# Patient Record
Sex: Male | Born: 1970 | Race: Black or African American | Hispanic: No | Marital: Married | Smoking: Never smoker
Health system: Southern US, Community
[De-identification: ages and names within clinical notes are randomized; demographics above are authoritative.]

## PROBLEM LIST (undated history)

## (undated) DIAGNOSIS — I10 Essential (primary) hypertension: Secondary | ICD-10-CM

## (undated) DIAGNOSIS — E785 Hyperlipidemia, unspecified: Secondary | ICD-10-CM

## (undated) HISTORY — PX: ARTHROSCOPIC REPAIR ACL: SUR80

## (undated) HISTORY — PX: WISDOM TOOTH EXTRACTION: SHX21

## (undated) HISTORY — DX: Hyperlipidemia, unspecified: E78.5

---

## 2011-09-28 ENCOUNTER — Emergency Department (HOSPITAL_COMMUNITY)
Admission: EM | Admit: 2011-09-28 | Discharge: 2011-09-28 | Disposition: A | Discharge status: Home / Self Care | Payer: BC Managed Care – PPO | Attending: Emergency Medicine | Admitting: Emergency Medicine

## 2011-09-28 ENCOUNTER — Emergency Department (HOSPITAL_COMMUNITY): Payer: BC Managed Care – PPO

## 2011-09-28 ENCOUNTER — Encounter (HOSPITAL_COMMUNITY): Payer: Self-pay | Admitting: *Deleted

## 2011-09-28 DIAGNOSIS — R609 Edema, unspecified: Secondary | ICD-10-CM | POA: Insufficient documentation

## 2011-09-28 DIAGNOSIS — X58XXXA Exposure to other specified factors, initial encounter: Secondary | ICD-10-CM | POA: Insufficient documentation

## 2011-09-28 DIAGNOSIS — Y9361 Activity, american tackle football: Secondary | ICD-10-CM | POA: Insufficient documentation

## 2011-09-28 DIAGNOSIS — S6990XA Unspecified injury of unspecified wrist, hand and finger(s), initial encounter: Secondary | ICD-10-CM

## 2011-09-28 DIAGNOSIS — M79609 Pain in unspecified limb: Secondary | ICD-10-CM | POA: Insufficient documentation

## 2011-09-28 NOTE — ED Notes (Signed)
Pt reports hurt left hand while playing football on Saturday. States heard something pop. Pain to left hand.

## 2011-09-28 NOTE — ED Notes (Signed)
Patient transported to X-ray 

## 2011-09-28 NOTE — ED Provider Notes (Signed)
History     CSN: [id number]  Arrival date & time 09/28/11  1421   First MD Initiated Contact with Patient 09/28/11 1457      Chief Complaint  Patient presents with  . Hand Pain    (Consider location/radiation/quality/duration/timing/severity/associated sxs/prior treatment) HPI Comments: Patient states he was playing football 3 days ago and dislocated R 5th finger which he pulled back into place   Now still having proximal 5th finger pain and hand pain without deformity   Patient is a 41 y.o. male presenting with hand pain. The history is provided by the patient.  Hand Pain This is a new problem. The current episode started in the past 7 days. Pertinent negatives include no joint swelling.    History reviewed. No pertinent past medical history.  Past Surgical History  Procedure Date  . Arthroscopic repair acl     No family history on file.  History  Substance Use Topics  . Smoking status: Never Smoker   . Smokeless tobacco: Not on file  . Alcohol Use: No      Review of Systems  Musculoskeletal: Negative for joint swelling.    Allergies  Review of patient's allergies indicates no known allergies.  Home Medications   Current Outpatient Rx  Name Route Sig Dispense Refill  . IBUPROFEN 400 MG PO TABS Oral Take 400 mg by mouth every 6 (six) hours as needed. For pain relief      BP 164/115  Pulse 90  Temp(Src) 97.6 F (36.4 C) (Oral)  Resp 14  SpO2 99%  Physical Exam  Constitutional: He is oriented to person, place, and time. He appears well-developed and well-nourished.  HENT:  Head: Normocephalic.  Eyes: Pupils are equal, round, and reactive to light.  Neck: Normal range of motion.  Cardiovascular: Normal rate.   Pulmonary/Chest: Effort normal.  Musculoskeletal: Normal range of motion. He exhibits edema and tenderness.       Hands: Neurological: He is alert and oriented to person, place, and time.  Skin: Skin is warm.  Psychiatric: He has a normal  mood and affect.    ED Course  Procedures (including critical care time)  Labs Reviewed - No data to display Dg Hand Complete Left  09/28/2011  *RADIOLOGY REPORT*  Clinical Data: Injured left hand.  LEFT HAND - COMPLETE 3+ VIEW  Comparison: None  Findings: The joint spaces are maintained.  No acute fracture.  IMPRESSION: No acute bony findings.  Original Report Authenticated By: P. Kalman Jewels, M.D.     1. Hand injury     Splint has been placed over the left fourth and fifth fingers to restrict flexion, extension or abduction for the next several days  MDM  We'll x-ray to rule out medical carpal fracture        Garald Balding, NP 09/28/11 1717  Garald Balding, NP 09/28/11 1717

## 2011-09-28 NOTE — Discharge Instructions (Signed)
Hand Injuries There are many types of hand injuries. You or your child may have a minor broken bone (fracture), sprain, bruises, or burns on the hand. HOME CARE  Keep the hand raised (elevated) above the level of the heart. Do this for the first few days until the pain and puffiness (swelling) get better.   Hand bandages (dressings) and splints are used to keep the hand still. This helps with pain and prevents more injury.   Do not remove the bandage and splint until your doctor says it is okay.   For broken bones, sprains, and bruises, you may put ice on the injured area.   Put ice in a plastic bag.   Place a towel between the skin and the bag.   Leave the ice on for 15 to 20 minutes every few hours. Do this for 2 to 3 days.   Medicine for pain and redness or puffiness (inflammation) is often helpful.   Some hand motion after an injury can decrease stiffness.   Do not do any activities that increase pain in the hand.   See your doctor for follow-up care as told.  GET HELP RIGHT AWAY IF:   The pain and puffiness get worse.   The pain is not controlled with medicine.   You or your child has a temperature by mouth above 102 F (38.9 C), not controlled by medicine.   There is pain when moving the fingers.   There is fluid (pus) coming from the wound.  MAKE SURE YOU:   Understand these instructions.   Will watch this condition.   Will get help right away if you or your child is not doing well or gets worse.  Document Released: 09/14/2009 Document Revised: 06/09/2011 Document Reviewed: 09/14/2009 [facility] Patient Information 2012 [place]. There are no fractures in the hand or in your fifth finger.  The fingers have been splinted and buddy taped prevent over flexion or extension.  For the next few days.  He can remove this on Saturday and reapply if needed, you can take over-the-counter ibuprofen or Advil on a regular basis for the next 3 days. If you still have  discomfort with use, please make an appointment with Dr. Apolonio Schneiders for further evaluation

## 2011-09-29 NOTE — ED Provider Notes (Signed)
Medical screening examination/treatment/procedure(s) were performed by non-physician practitioner and as supervising physician I was immediately available for consultation/collaboration.   Mylinda Latina III, MD 09/29/11 [phone]

## 2013-04-20 IMAGING — CR DG HAND COMPLETE 3+V*L*
3 series · 3 of 3 positions shown · non-contrast
Comparison: None

CLINICAL DATA: Injured left hand.

LEFT HAND - COMPLETE 3+ VIEW

[x hand pa left]
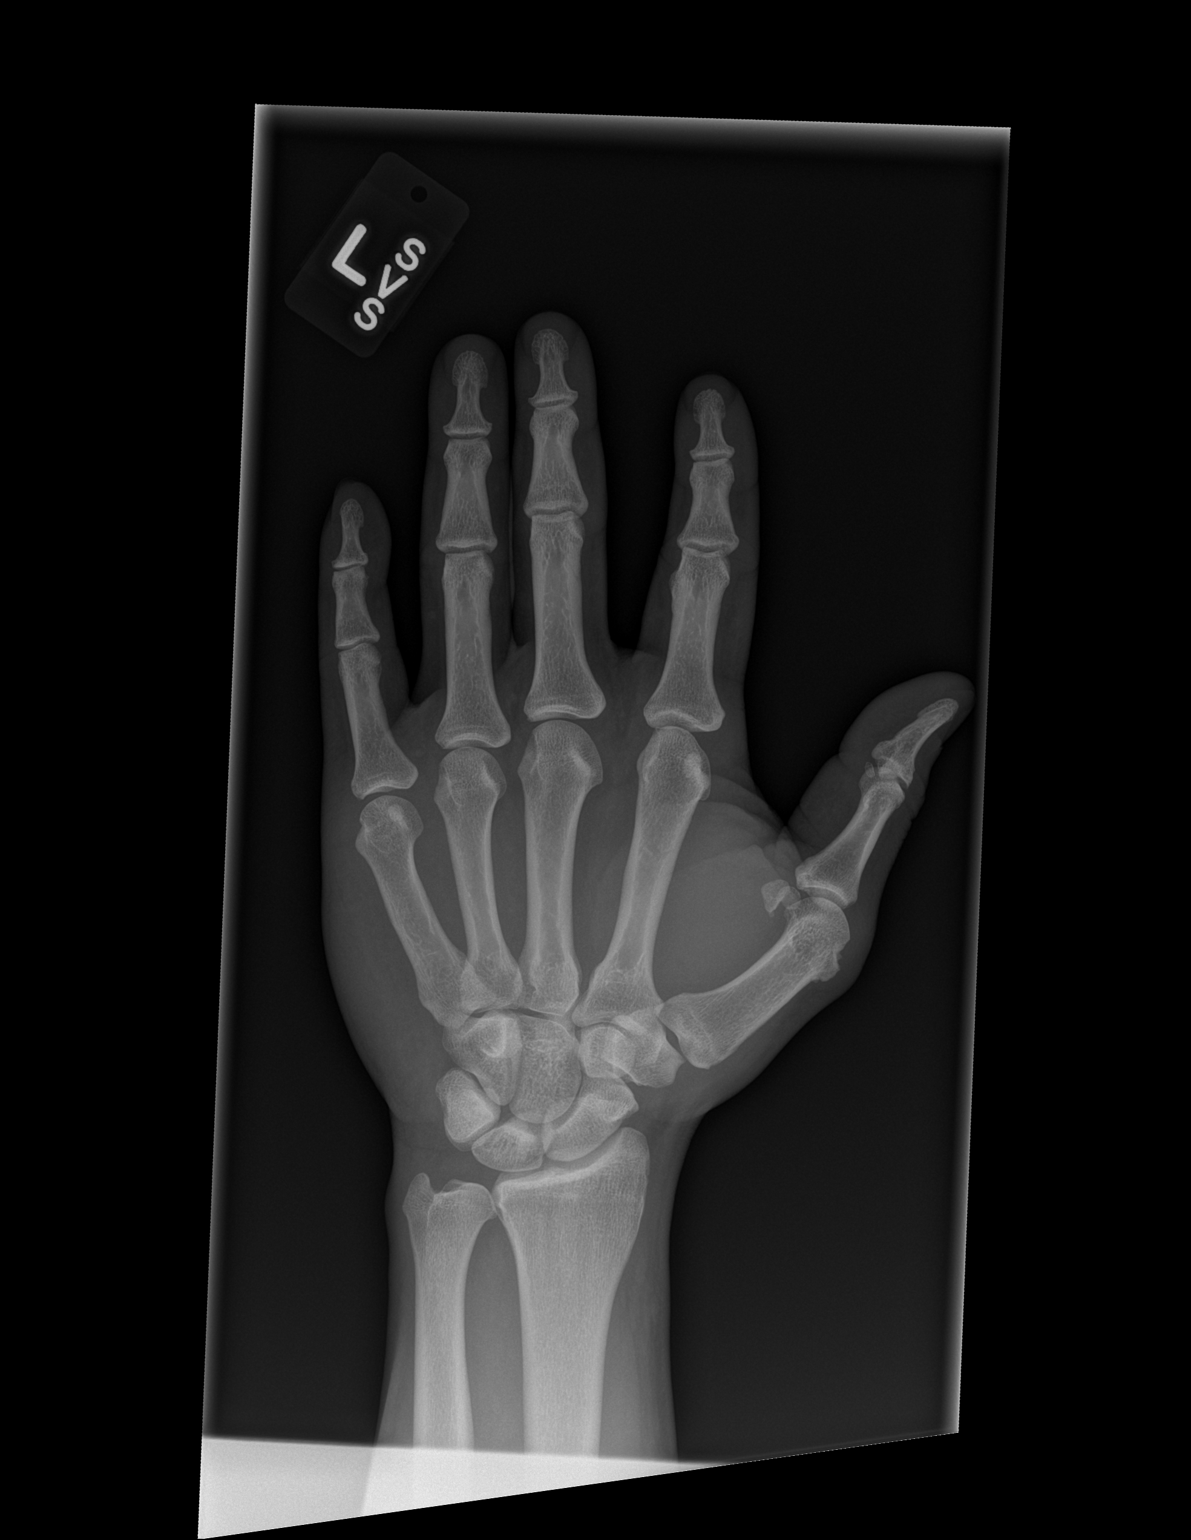

[x hand obl left]
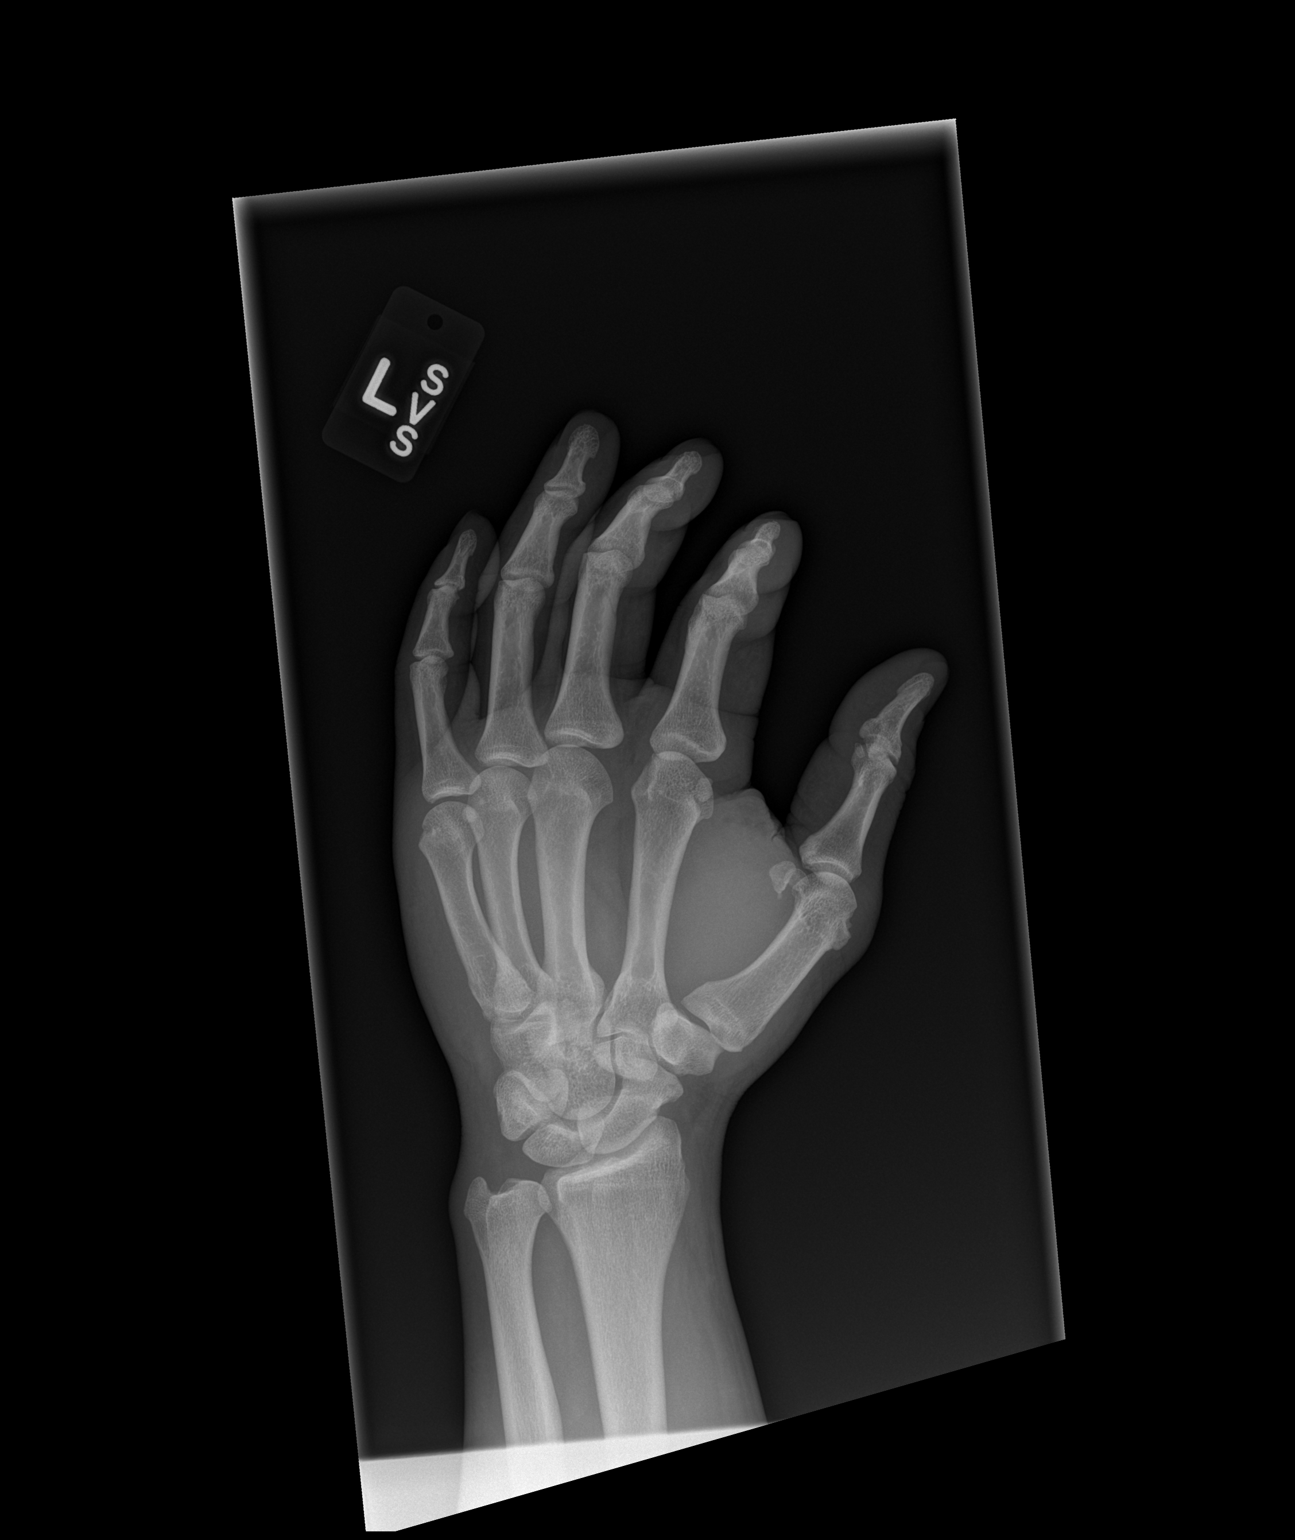

[x hand lat left]
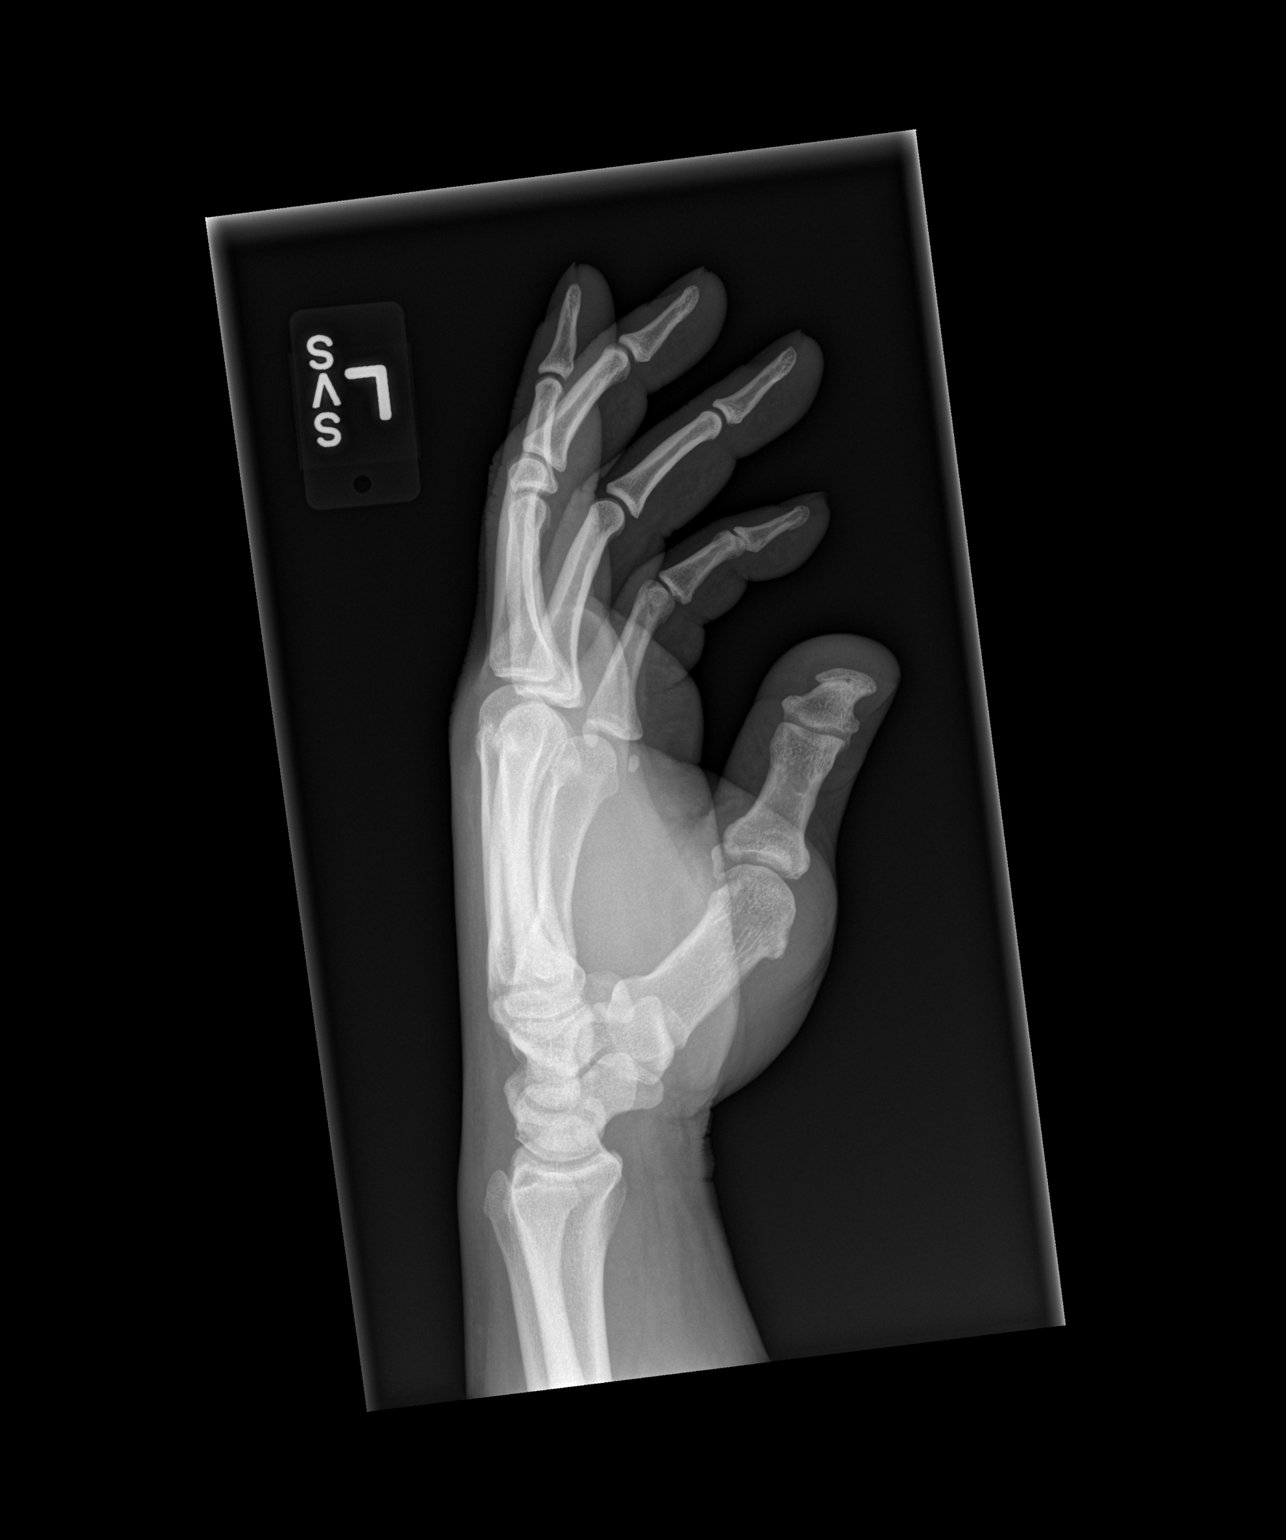

[3 of 3 positions shown; findings below may reference images not displayed]

FINDINGS: The joint spaces are maintained.  No acute fracture.
IMPRESSION: No acute bony findings.

## 2018-06-12 ENCOUNTER — Encounter: Payer: Self-pay | Admitting: Family Medicine

## 2018-06-12 ENCOUNTER — Ambulatory Visit (INDEPENDENT_AMBULATORY_CARE_PROVIDER_SITE_OTHER): Payer: BLUE CROSS/BLUE SHIELD | Admitting: Family Medicine

## 2018-06-12 VITALS — BP 150/100 | HR 75 | Temp 98.1°F | Ht 68.0 in | Wt 227.5 lb

## 2018-06-12 DIAGNOSIS — M25551 Pain in right hip: Secondary | ICD-10-CM

## 2018-06-12 DIAGNOSIS — R350 Frequency of micturition: Secondary | ICD-10-CM

## 2018-06-12 DIAGNOSIS — R7309 Other abnormal glucose: Secondary | ICD-10-CM | POA: Diagnosis not present

## 2018-06-12 DIAGNOSIS — I10 Essential (primary) hypertension: Secondary | ICD-10-CM | POA: Diagnosis not present

## 2018-06-12 DIAGNOSIS — Z7689 Persons encountering health services in other specified circumstances: Secondary | ICD-10-CM

## 2018-06-12 DIAGNOSIS — E785 Hyperlipidemia, unspecified: Secondary | ICD-10-CM

## 2018-06-12 DIAGNOSIS — Z131 Encounter for screening for diabetes mellitus: Secondary | ICD-10-CM

## 2018-06-12 DIAGNOSIS — Z125 Encounter for screening for malignant neoplasm of prostate: Secondary | ICD-10-CM | POA: Diagnosis not present

## 2018-06-12 DIAGNOSIS — Z13220 Encounter for screening for lipoid disorders: Secondary | ICD-10-CM | POA: Diagnosis not present

## 2018-06-12 LAB — URINALYSIS, ROUTINE W REFLEX MICROSCOPIC
BILIRUBIN URINE: NEGATIVE
Bacteria, UA: NONE SEEN /HPF
GLUCOSE, UA: NEGATIVE
HGB URINE DIPSTICK: NEGATIVE
KETONES UR: NEGATIVE
LEUKOCYTES UA: NEGATIVE
NITRITE: NEGATIVE
PH: 6 (ref 5.0–8.0)
RBC / HPF: NONE SEEN /HPF (ref 0–2)
Specific Gravity, Urine: 1.02 (ref 1.001–1.03)
Squamous Epithelial / LPF: NONE SEEN /HPF (ref <=5)
WBC, UA: NONE SEEN /HPF (ref 0–5)

## 2018-06-12 LAB — MICROSCOPIC MESSAGE

## 2018-06-12 MED ORDER — LISINOPRIL 20 MG PO TABS: 20.0000 mg | ORAL_TABLET | Freq: Every day | ORAL | 3 refills | Status: DC

## 2018-06-12 NOTE — Progress Notes (Signed)
Patient ID: Jason Leonard, male    DOB: 1970/10/28, 47 y.o.   MRN: [id number]  PCP: Delsa Grana, PA-C  Chief Complaint  Patient presents with  . Establish Care    Patient has concerns of frequent urination, and right hip pain.    Subjective:   Jason Leonard is a 47 y.o. male, presents to clinic with CC of urinary frequency and unrelated intermittent right hip pain. Urinary sx are newer - hes drinking mt dew at night, also drinks coffee, he's having increased urinary frequency over nights, does work third shift, he also drinks coconut water and water throughout the day and while at work.  Urine frequency gradual began this week, he denies dyuria, pain, hematuria, dribbling, double voiding, anal discharge. He describes his urine as clear to yellow - no weight loss, no fatigue, no change in appetite, no abd pain, N, V, flank pain  Right hip pain when he's laid down for too long or moves too fast he has worse pain, bothering him for 2 years. Pain happens with external rotation, no radiation to groin or down leg.  No crepitus, no exacerbation of pain with weight bearing.  No injury Moved here in 2010 - [place] -cannot remember PCP states he is not really been to a doctor in a long time  Says he is to have some elevated cholesterol  Blood pressure is elevated here today, he denies any history of elevated blood pressure.  He denies any chest pain, shortness of breath, near syncope, palpitations.  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   There are no active problems to display for this patient.    Prior to Admission medications   Medication Sig Start Date End Date Taking?  Authorizing Provider  magnesium 30 MG tablet Take 30 mg by mouth 2 (two) times daily.   Yes [provider]  Omega 3-6-9 Fatty Acids (OMEGA 3-6-9 COMPLEX PO) Take by mouth.   Yes [provider]     No Known Allergies   Family History  Problem Relation Age of Onset  . Hypertension Mother   . Hypertension Father   . Lupus Sister   . Diabetes Maternal Grandfather   . Cancer Maternal Grandfather   . Cancer Paternal Grandmother      Social History   Socioeconomic History  . Marital status: Married    Spouse name: Not on file  . Number of children: 2  . Years of education: Not on file  . Highest education level: Not on file  Occupational History  . Not on file  Social Needs  . Financial resource strain: Not on file  . Food insecurity:    Worry: Not on file    Inability: Not on file  . Transportation needs:    Medical: Not on file    Non-medical: Not on file  Tobacco Use  . Smoking status: Never Smoker  . Smokeless tobacco: Never Used  Substance and Sexual Activity  . Alcohol use: No  . Drug use: No  . Sexual activity: Yes    Comment: one partner wife - 2011  Lifestyle  . Physical activity:  Days per week: 7 days    Minutes per session: 30 min  . Stress: Not on file  Relationships  . Social connections:    Talks on phone: Not on file    Gets together: Not on file    Attends religious service: Not on file    Active member of club or organization: Not on file    Attends meetings of clubs or organizations: Not on file    Relationship status: Not on file  . Intimate partner violence:    Fear of current or ex partner: Not on file    Emotionally abused: Not on file    Physically abused: Not on file    Forced sexual activity: Not on file  Other Topics Concern  . Not on file  Social History Narrative  . Not on file     Review of Systems  Constitutional: Negative.  Negative for activity change, appetite change, fatigue and unexpected weight  change.  HENT: Negative.   Eyes: Negative.   Respiratory: Negative.  Negative for shortness of breath.   Cardiovascular: Negative.  Negative for chest pain, palpitations and leg swelling.  Gastrointestinal: Negative.  Negative for abdominal pain and blood in stool.  Endocrine: Negative.   Genitourinary: Negative.  Negative for decreased urine volume, difficulty urinating, testicular pain and urgency.  Musculoskeletal: Negative.  Negative for back pain, joint swelling and neck pain.  Skin: Negative.  Negative for color change and pallor.  Allergic/Immunologic: Negative.   Neurological: Negative.  Negative for syncope, weakness, light-headedness and numbness.  Psychiatric/Behavioral: Negative.  Negative for confusion, dysphoric mood, self-injury and suicidal ideas. The patient is not nervous/anxious.   All other systems reviewed and are negative.      Objective:    Vitals:   06/12/18 1419  BP: (!) 150/100  Pulse: 75  Temp: 98.1 F (36.7 C)  TempSrc: Oral  SpO2: 98%  Weight: 227 lb 8 oz (103.2 kg)  Height: 5\' 8"  (1.727 m)      Physical Exam Vitals signs and nursing note reviewed.  Constitutional:      General: He is not in acute distress.    Appearance: Normal appearance. He is well-developed. He is not ill-appearing, toxic-appearing or diaphoretic.     Comments: Muscular, fit, well appearing AAM, NAD  HENT:     Head: Normocephalic and atraumatic.     Jaw: No trismus.     Right Ear: Tympanic membrane, ear canal and external ear normal.     Left Ear: Tympanic membrane, ear canal and external ear normal.     Nose: Nose normal. No mucosal edema, congestion or rhinorrhea.     Right Sinus: No maxillary sinus tenderness or frontal sinus tenderness.     Left Sinus: No maxillary sinus tenderness or frontal sinus tenderness.     Mouth/Throat:     Mouth: Mucous membranes are moist.     Pharynx: Uvula midline. No oropharyngeal exudate, posterior oropharyngeal erythema or uvula  swelling.  Eyes:     General: Lids are normal.     Conjunctiva/sclera: Conjunctivae normal.     Pupils: Pupils are equal, round, and reactive to light.  Neck:     Musculoskeletal: Normal range of motion and neck supple. No neck rigidity.     Trachea: Trachea and phonation normal. No tracheal deviation.  Cardiovascular:     Rate and Rhythm: Normal rate and regular rhythm.     Pulses: Normal pulses.          Radial  pulses are 2+ on the right side and 2+ on the left side.       Posterior tibial pulses are 2+ on the right side and 2+ on the left side.     Heart sounds: Normal heart sounds. No murmur. No friction rub. No gallop.   Pulmonary:     Effort: Pulmonary effort is normal. No respiratory distress.     Breath sounds: Normal breath sounds. No stridor. No wheezing, rhonchi or rales.  Abdominal:     General: Bowel sounds are normal. There is no distension.     Palpations: Abdomen is soft. There is no mass.     Tenderness: There is no abdominal tenderness. There is no right CVA tenderness, left CVA tenderness, guarding or rebound.  Musculoskeletal: Normal range of motion.     Right hip: He exhibits normal range of motion, normal strength, no bony tenderness, no swelling, no crepitus, no deformity and no laceration.     Left hip: Normal.     Comments: Right hip pain with external rotation and flexion, no decreased ROM, no bony tenderness, no crepitus palpated, no IT band tenderness  Skin:    General: Skin is warm and dry.     Capillary Refill: Capillary refill takes less than 2 seconds.     Coloration: Skin is not jaundiced or pale.     Findings: No erythema or rash.  Neurological:     Mental Status: He is alert and oriented to person, place, and time.     Motor: No weakness.     Coordination: Coordination normal.     Gait: Gait normal.  Psychiatric:        Mood and Affect: Mood normal.        Speech: Speech normal.        Behavior: Behavior normal.        Thought Content: Thought  content normal.        Judgment: Judgment normal.            Assessment & Plan:   .47 y/o male, new to establish care, presents with urinary frequency x1 week and unrelated several months of right hip pain.    1. Urinary frequency History given slightly unusual the amount of frequency described as not very concerning to me for infection.  He has had no change in urine color or odor does not seem suspicious at all for kidney stone and not highly suspicious for UTI, no totally consistent with LUTS either - will rule out UTI, he seems low risk for STDs, monogamous with wife for about 8 years but will screen for GC chlamydia and trichomonas, screening PSA.   - Urinalysis, Routine w reflex microscopic - PSA - C. trachomatis/N. gonorrhoeae RNA - Trichomonas vaginalis RNA, Ql,Males UA negative except for trace protein -no other signs of infection  2. 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 Hypertension, unspecified type 150/100 in clinic, asymptomatic, no concern for endorgan damage, no cardiac symptoms at all, patient believes it is from slight weight gain and decreased  working out over the last month or so, feel like it high enough that we should start treatment and follow-up.  Encouraged to work on [employer], aerobic activity in addition to his other exercise.  Return for labs - lisinopril (PRINIVIL,ZESTRIL) 20 MG tablet; Take 1 tablet (20 mg total) by mouth daily.  Dispense: 90 tablet; Refill: 3  4. Encounter to establish care with new  doctor Request records All history updated in chart as able  5. Screening for hyperlipidemia Patient gives history of elevated cholesterol, return for fasting lipids - Lipid Panel  6. Screening for diabetes mellitus Return for fasting labs - CMP and Hemoglobin A1c Will obtain A1c with urinary frequency instead of waiting for just the fasting sugar  Obtain basic labs, records, start lisinopril, f/up in 1 months for BP recheck and set up CPE in near future  Delsa Grana, PA-C 06/12/18 2:28 PM

## 2018-06-12 NOTE — Patient Instructions (Addendum)
Return when convenient - for fasting labs to check cholesterol  8 hours only water or black coffee plain no cream no coffee

## 2018-06-13 LAB — LIPID PANEL
Cholesterol: 195 mg/dL (ref <200)
HDL: 50 mg/dL (ref >40)
LDL Cholesterol (Calc): 116 mg/dL (calc) — ABNORMAL HIGH
Non-HDL Cholesterol (Calc): 145 mg/dL (calc) — ABNORMAL HIGH (ref <130)
Total CHOL/HDL Ratio: 3.9 (calc) (ref <5.0)
Triglycerides: 175 mg/dL — ABNORMAL HIGH (ref <150)

## 2018-06-13 LAB — HEMOGLOBIN A1C
Hgb A1c MFr Bld: 5.6 % of total Hgb (ref <5.7)
MEAN PLASMA GLUCOSE: 114 (calc)
eAG (mmol/L): 6.3 (calc)

## 2018-06-13 LAB — PSA: PSA: 1.5 ng/mL

## 2018-06-13 LAB — COMPLETE METABOLIC PANEL WITHOUT GFR
AG Ratio: 1.6 (calc) (ref 1.0–2.5)
ALT: 20 U/L (ref 9–46)
AST: 32 U/L (ref 10–40)
Albumin: 4.5 g/dL (ref 3.6–5.1)
Alkaline phosphatase (APISO): 44 U/L (ref 40–115)
BUN/Creatinine Ratio: 13 (calc) (ref 6–22)
BUN: 18 mg/dL (ref 7–25)
CO2: 27 mmol/L (ref 20–32)
Calcium: 9.7 mg/dL (ref 8.6–10.3)
Chloride: 101 mmol/L (ref 98–110)
Creat: 1.43 mg/dL — ABNORMAL HIGH (ref 0.60–1.35)
GFR, Est African American: 67 mL/min/{1.73_m2}
GFR, Est Non African American: 58 mL/min/{1.73_m2} — ABNORMAL LOW
Globulin: 2.8 g/dL (ref 1.9–3.7)
Glucose, Bld: 79 mg/dL (ref 65–99)
Potassium: 4.2 mmol/L (ref 3.5–5.3)
Sodium: 140 mmol/L (ref 135–146)
Total Bilirubin: 0.7 mg/dL (ref 0.2–1.2)
Total Protein: 7.3 g/dL (ref 6.1–8.1)

## 2018-06-14 ENCOUNTER — Encounter: Payer: Self-pay | Admitting: Family Medicine

## 2018-06-14 DIAGNOSIS — I10 Essential (primary) hypertension: Secondary | ICD-10-CM | POA: Insufficient documentation

## 2018-06-14 DIAGNOSIS — E785 Hyperlipidemia, unspecified: Secondary | ICD-10-CM | POA: Insufficient documentation

## 2018-06-14 LAB — TRICHOMONAS VAGINALIS RNA, QL,MALES: Trichomonas vaginalis RNA: NOT DETECTED

## 2018-06-14 LAB — C. TRACHOMATIS/N. GONORRHOEAE RNA
C. trachomatis RNA, TMA: NOT DETECTED
N. gonorrhoeae RNA, TMA: NOT DETECTED

## 2018-06-14 MED ORDER — SIMVASTATIN 20 MG PO TABS: 20.0000 mg | ORAL_TABLET | Freq: Every day | ORAL | 3 refills | Status: DC

## 2018-07-03 ENCOUNTER — Ambulatory Visit (INDEPENDENT_AMBULATORY_CARE_PROVIDER_SITE_OTHER): Payer: BLUE CROSS/BLUE SHIELD | Admitting: Family Medicine

## 2018-07-03 ENCOUNTER — Encounter: Payer: Self-pay | Admitting: Family Medicine

## 2018-07-03 VITALS — BP 150/98 | HR 89 | Temp 98.7°F | Resp 15 | Ht 68.0 in | Wt 226.4 lb

## 2018-07-03 DIAGNOSIS — Z0000 Encounter for general adult medical examination without abnormal findings: Secondary | ICD-10-CM | POA: Diagnosis not present

## 2018-07-03 DIAGNOSIS — N184 Chronic kidney disease, stage 4 (severe): Secondary | ICD-10-CM

## 2018-07-03 DIAGNOSIS — E782 Mixed hyperlipidemia: Secondary | ICD-10-CM | POA: Diagnosis not present

## 2018-07-03 DIAGNOSIS — I129 Hypertensive chronic kidney disease with stage 1 through stage 4 chronic kidney disease, or unspecified chronic kidney disease: Secondary | ICD-10-CM | POA: Insufficient documentation

## 2018-07-03 DIAGNOSIS — I10 Essential (primary) hypertension: Secondary | ICD-10-CM | POA: Diagnosis not present

## 2018-07-03 LAB — COMPLETE METABOLIC PANEL WITH GFR
AG Ratio: 1.5 (calc) (ref 1.0–2.5)
ALBUMIN MSPROF: 4.2 g/dL (ref 3.6–5.1)
ALKALINE PHOSPHATASE (APISO): 44 U/L (ref 40–115)
ALT: 24 U/L (ref 9–46)
AST: 29 U/L (ref 10–40)
BUN / CREAT RATIO: 12 (calc) (ref 6–22)
BUN: 17 mg/dL (ref 7–25)
CO2: 31 mmol/L (ref 20–32)
CREATININE: 1.46 mg/dL — AB (ref 0.60–1.35)
Calcium: 9.8 mg/dL (ref 8.6–10.3)
Chloride: 104 mmol/L (ref 98–110)
GFR, EST AFRICAN AMERICAN: 65 mL/min/{1.73_m2} (ref >=60)
GFR, Est Non African American: 56 mL/min/{1.73_m2} — ABNORMAL LOW (ref >=60)
Globulin: 2.8 g/dL (calc) (ref 1.9–3.7)
Glucose, Bld: 93 mg/dL (ref 65–99)
Potassium: 4.1 mmol/L (ref 3.5–5.3)
Sodium: 143 mmol/L (ref 135–146)
TOTAL PROTEIN: 7 g/dL (ref 6.1–8.1)
Total Bilirubin: 0.5 mg/dL (ref 0.2–1.2)

## 2018-07-03 MED ORDER — AMLODIPINE BESY-BENAZEPRIL HCL 5-40 MG PO CAPS: 1.0000 | ORAL_CAPSULE | Freq: Every day | ORAL | 0 refills | Status: DC

## 2018-07-03 NOTE — Progress Notes (Signed)
Patient: Jason Leonard, Male    DOB: [birth year], 47 y.o.   MRN: [id number] Visit Date: 07/03/2018  Today's Provider: Delsa Grana, PA-C   Chief Complaint  Patient presents with  . Annual Exam    Patient states no concerns this visit.   Subjective:    Annual physical exam Jason Leonard is a 47 y.o. male who presents today for health maintenance and complete physical. He feels well. He reports exercising a lot, but currently less than he normally does.Marland Kitchen He reports he is sleeping fairly well (works third shift)..  -----------------------------------------------------------------   Follow up for HTN and HLD  Started statin and lisinopril 3 weeks ago, reports good compliance with meds, maybe missed one dose, BP more elevated today that last visit, diet has been worse, says hes not exercising as much as he would like to right now.  He is working 2 jobs.  Not doing any adjustment to diet after receiving cholesterol handout in mail with his labs.   He is tolerating statin medication, no SE, no myalgias.  Bp more elevated today, no HA, CP, SOB, palpitations, LE edema, fatigue, orthopnea, visual disturbances.  He continues to endorse right hip/groin pain, continues to re-pole it with quick movements when he forgets about it he has not seen his colleague who is athletic trainer or in sports medicine yet he will see him in the next 2 weeks.    His urinary symptoms have resolved, no more nocturia urinary frequency or urgency.  Review of Systems  Constitutional: Negative.   HENT: Negative.   Eyes: Negative.   Respiratory: Negative.   Cardiovascular: Negative.   Gastrointestinal: Negative.   Endocrine: Negative.   Genitourinary: Negative.   Musculoskeletal: Negative.   Skin: Negative.   Allergic/Immunologic: Negative.   Neurological: Negative.   Hematological: Negative.   Psychiatric/Behavioral: Negative.   All other systems reviewed and are negative.   Social History      He   reports that he has never smoked. He has never used smokeless tobacco. He reports that he does not drink alcohol or use drugs.       Social History   Socioeconomic History  . Marital status: Married    Spouse name: Not on file  . Number of children: 2  . Years of education: Not on file  . Highest education level: Not on file  Occupational History  . Not on file  Social Needs  . Financial resource strain: Not on file  . Food insecurity:    Worry: Not on file    Inability: Not on file  . Transportation needs:    Medical: Not on file    Non-medical: Not on file  Tobacco Use  . Smoking status: Never Smoker  . Smokeless tobacco: Never Used  Substance and Sexual Activity  . Alcohol use: No  . Drug use: No  . Sexual activity: Yes    Comment: one partner wife - 2011  Lifestyle  . Physical activity:    Days per week: 7 days    Minutes per session: 30 min  . Stress: Not on file  Relationships  . Social connections:    Talks on phone: Not on file    Gets together: Not on file    Attends religious service: Not on file    Active member of club or organization: Not on file    Attends meetings of clubs or organizations: Not on file    Relationship status: Not on file  Other Topics  Concern  . Not on file  Social History Narrative  . Not on file    Past Medical History:  Diagnosis Date  . Hyperlipidemia    low HDL, high LDL - no meds, diet controlled     Patient Active Problem List   Diagnosis Date Noted  . Hypertension 06/14/2018  . Hyperlipidemia 06/14/2018    Past Surgical History:  Procedure Laterality Date  . ARTHROSCOPIC REPAIR ACL      Family History        Family Status  Relation Name Status  . Mother  (Not Specified)  . Father  (Not Specified)  . Sister  (Not Specified)  . MGF  (Not Specified)  . PGM  (Not Specified)        His family history includes Cancer in his maternal grandfather and paternal grandmother; Diabetes in his maternal grandfather;  Hypertension in his father and mother; Lupus in his sister.      No Known Allergies   Current Outpatient Medications:  .  lisinopril (PRINIVIL,ZESTRIL) 20 MG tablet, Take 1 tablet (20 mg total) by mouth daily., Disp: 90 tablet, Rfl: 3 .  magnesium 30 MG tablet, Take 30 mg by mouth 2 (two) times daily., Disp: , Rfl:  .  Omega 3-6-9 Fatty Acids (OMEGA 3-6-9 COMPLEX PO), Take by mouth., Disp: , Rfl:  .  simvastatin (ZOCOR) 20 MG tablet, Take 1 tablet (20 mg total) by mouth at bedtime., Disp: 90 tablet, Rfl: 3 .  amLODipine-benazepril (LOTREL) 5-40 MG capsule, Take 1 capsule by mouth daily., Disp: 30 capsule, Rfl: 0   Patient Care Team: Delsa Grana, PA-C as PCP - General (Family Medicine)      Objective:   Vitals: BP (!) 150/98   Pulse 89   Temp 98.7 F (37.1 C) (Oral)   Resp 15   Ht 5\' 8"  (1.727 m)   Wt 226 lb 6 oz (102.7 kg)   SpO2 98%   BMI 34.42 kg/m    Vitals:   07/03/18 1515  BP: (!) 150/98  Pulse: 89  Resp: 15  Temp: 98.7 F (37.1 C)  TempSrc: Oral  SpO2: 98%  Weight: 226 lb 6 oz (102.7 kg)  Height: 5\' 8"  (1.727 m)     Physical Exam Vitals signs and nursing note reviewed.  Constitutional:      General: He is not in acute distress.    Appearance: Normal appearance. He is well-developed. He is not ill-appearing, toxic-appearing or diaphoretic.  HENT:     Head: Normocephalic and atraumatic.     Jaw: No trismus.     Right Ear: Tympanic membrane, ear canal and external ear normal.     Left Ear: Tympanic membrane, ear canal and external ear normal.     Nose: Nose normal. No mucosal edema, congestion or rhinorrhea.     Right Sinus: No maxillary sinus tenderness or frontal sinus tenderness.     Left Sinus: No maxillary sinus tenderness or frontal sinus tenderness.     Mouth/Throat:     Pharynx: Uvula midline. No oropharyngeal exudate, posterior oropharyngeal erythema or uvula swelling.  Eyes:     General: Lids are normal.     Conjunctiva/sclera: Conjunctivae  normal.     Pupils: Pupils are equal, round, and reactive to light.  Neck:     Musculoskeletal: Normal range of motion and neck supple. No neck rigidity.     Trachea: Trachea and phonation normal. No tracheal deviation.  Cardiovascular:     Rate and Rhythm:  Normal rate and regular rhythm.     Pulses: Normal pulses.          Radial pulses are 2+ on the right side and 2+ on the left side.       Posterior tibial pulses are 2+ on the right side and 2+ on the left side.     Heart sounds: Normal heart sounds. No murmur. No friction rub. No gallop.   Pulmonary:     Effort: Pulmonary effort is normal. No respiratory distress.     Breath sounds: Normal breath sounds. No stridor. No wheezing, rhonchi or rales.  Chest:     Chest wall: No tenderness.  Abdominal:     General: Bowel sounds are normal. There is no distension.     Palpations: Abdomen is soft. There is no mass.     Tenderness: There is no abdominal tenderness. There is no guarding or rebound.     Hernia: No hernia is present.  Musculoskeletal: Normal range of motion.  Lymphadenopathy:     Cervical: No cervical adenopathy.  Skin:    General: Skin is warm and dry.     Capillary Refill: Capillary refill takes less than 2 seconds.     Findings: No rash.  Neurological:     General: No focal deficit present.     Mental Status: He is alert and oriented to person, place, and time.     Gait: Gait normal.  Psychiatric:        Mood and Affect: Mood normal.        Speech: Speech normal.        Behavior: Behavior normal.      Depression Screen PHQ 2/9 Scores 07/03/2018  PHQ - 2 Score 0  PHQ- 9 Score 0     Office Visit from 07/03/2018 in [place]  AUDIT-C Score  0       Assessment & Plan:     Routine Health Maintenance and Physical Exam  Exercise Activities and Dietary recommendations Goals - increase aerobic exercise, DASH diet and avoid high fat foods    Discussed health benefits of physical  activity, and encouraged him to engage in regular exercise appropriate for his age and condition.    There is no immunization history on file for this patient.  Health Maintenance  Topic Date Due  . HIV Screening  09/20/1985  . TETANUS/TDAP  09/20/1989  . INFLUENZA VACCINE  02/02/2019 (Originally 02/01/2018)    Will do HIV with next labs Tdap deferred Flu deferred     Bp recheck 2x on right arm while in exam room for CPE - BP 142/98 and 140/96  Problem List Items Addressed This Visit      Cardiovascular and Mediastinum   Hypertension    BP elevated, not at goal, no improvement with 20 mg lisinopril. More concerned for diastolic elevation - would like to switch meds to combo pill with ACEI or ARB + norvasc Hold lisinopril 20 and start lotrel 5-40 - monitor BP and call with readings in 2 weeks (encouraged use of mychart - daughter is a nurse who will check his BP for him) Dash diet Call sooner for recheck if BP >140/90 on average F/up for recheck of labs and routine f/up visit March 2020      Relevant Medications   amLODipine-benazepril (LOTREL) 5-40 MG capsule   Other Relevant Orders   COMPLETE METABOLIC PANEL WITH GFR   Lipid panel   COMPLETE METABOLIC PANEL WITH GFR  Genitourinary   CKD stage 4 secondary to hypertension ([place])    Recheck renal function after starting ACEI      Relevant Orders   COMPLETE METABOLIC PANEL WITH GFR     Other   Hyperlipidemia    Tolerating statin, continue med, work on diet and exercise, due for FLP and recheck CMP 09/11/2018      Relevant Medications   amLODipine-benazepril (LOTREL) 5-40 MG capsule   Other Relevant Orders   COMPLETE METABOLIC PANEL WITH GFR   Lipid panel   COMPLETE METABOLIC PANEL WITH GFR    Other Visit Diagnoses    General medical exam    -  Primary       Delsa Grana, PA-C 07/03/18 5:27 PM  [facility]

## 2018-07-03 NOTE — Assessment & Plan Note (Signed)
BP elevated, not at goal, no improvement with 20 mg lisinopril. More concerned for diastolic elevation - would like to switch meds to combo pill with ACEI or ARB + norvasc Hold lisinopril 20 and start lotrel 5-40 - monitor BP and call with readings in 2 weeks (encouraged use of mychart - daughter is a nurse who will check his BP for him) Dash diet Call sooner for recheck if BP >140/90 on average F/up for recheck of labs and routine f/up visit March 2020

## 2018-07-03 NOTE — Assessment & Plan Note (Signed)
Tolerating statin, continue med, work on diet and exercise, due for FLP and recheck CMP 09/11/2018

## 2018-07-03 NOTE — Assessment & Plan Note (Signed)
Recheck renal function after starting ACEI

## 2018-07-03 NOTE — Patient Instructions (Addendum)
The 10-year ASCVD risk score Mikey Bussing DC Brooke Bonito., et al., 2013) is: 10.1%   Values used to calculate the score:     Age: 47 years     Sex: Male     Is Non-Hispanic African American: Yes     Diabetic: No     Tobacco smoker: No     Systolic Blood Pressure: 505 mmHg     Is BP treated: Yes     HDL Cholesterol: 50 mg/dL     Total Cholesterol: 195 mg/dL    Familial Hypercholesterolemia Familial hypercholesterolemia (FH) is a genetic disorder that causes a very high level of LDL (low-density lipoprotein) cholesterol. Cholesterol is a waxy, fat-like substance that your body needs to build cells. Your body makes all the cholesterol it needs in the liver and removes extra (excess) cholesterol from the blood as needed. Excess cholesterol comes from food that you eat. In people who have FH, the body is not able to remove LDL cholesterol from the blood as it should. A high level of LDL cholesterol puts you at higher risk for narrowing and hardening of your arteries (atherosclerosis) at an early age. This raises your risk for heart disease and stroke. What are the causes? FH is passed from parent to child (inherited). FH is caused by an inherited gene defect (genetic mutation) that makes it hard for the liver to remove LDL cholesterol from the blood. The gene may be inherited from one parent or both parents. What increases the risk? You may be at higher risk for FH if:  You have a family history of the condition. If both parents carry the genetic mutation, their children are at higher risk for a more severe form of FH, with symptoms that start at an earlier age. What are the signs or symptoms? You may have a high level of LDL cholesterol before you develop symptoms. Symptoms of FH may include:  Cholesterol nodules (xanthomas) on the cords of tissue that connect muscles to bones (tendons). Xanthomas often form on the long tendon at the back of the ankle (Achilles tendon) or on the tendons on the back of the  hands.  Cholesterol deposits (xanthelasmas) under the skin of the eyelids.  A gray or blue ring around the white part of the eye (corneal arcus). Complications of FH can occur due to atherosclerosis. Atherosclerosis may cause damage to an area of the body that is not getting enough blood. Complications of FH may include:  Chest pain (angina) and shortness of breath due to narrowed or blocked arteries in the heart (coronary artery disease).  Pain and cramping in the back of the lower legs (calves) when walking (claudication).  Interruption in blood flow to the brain (stroke). This may cause: ? Loss of balance. ? Vision loss. ? Sudden weakness or numbness on one side of the body. How is this diagnosed? This condition may be diagnosed based on:  Your symptoms.  Your medical history, including any family history of FH or early coronary heart disease.  A physical exam.  A blood test to check for the genetic mutation that causes FH. Your family members may also be tested. How is this treated? There is no cure for FH, but treatment can lower LDL cholesterol levels and lower your risk for heart attack or stroke. Treatment should be started as soon as you are diagnosed. Treatment may include:  A type of medicine that lowers your cholesterol (statin). If statins do not help, your health care provider may try  other kinds of cholesterol-lowering medicines. The exact combination of medicines depends on the severity of your symptoms.  A procedure to filter LDL from your blood (apheresis). You may need this treatment if you have a severe form of FH.  Making lifestyle changes that are healthy for your heart, such as lowering the amount of fat and cholesterol in your diet. Follow these instructions at home: Lifestyle   Lose weight, if directed by your health care provider.  Follow instructions from your health care provider about eating a healthy diet. Your health care provider may  recommend: ? Working with a diet and nutrition specialist (dietitian), who can help you make a healthy eating plan and help you maintain a healthy weight. ? Eating less fat and cholesterol. Avoid fatty meats, fried foods, and whole-fat dairy. ? Eating more vegetables, fruits, and whole grains. ? Limiting your intake of alcohol.  Be physically active. Ask your health care provider what type of exercise is best for you.  Do not use any products that contain nicotine or tobacco, such as cigarettes and e-cigarettes. If you need help quitting, ask your health care provider.  Work with your health care provider to manage any other conditions you have, such as high blood pressure (hypertension) or diabetes. These conditions affect your heart. General instructions  Take over-the-counter and prescription medicines only as told by your health care provider.  Keep all follow-up visits as told by your health care provider. This is important. Contact a health care provider if:  You have pain or cramps in your calf when you walk. Get help right away if:   You have sudden, unexplained discomfort in your chest, arms, back, neck, jaw, or upper body.  You have trouble breathing.  You have a sudden, severe headache with no known cause.  You have any symptoms of a stroke. "BE FAST" is an easy way to remember the main warning signs of a stroke: ? B - Balance. Signs are dizziness, sudden trouble walking, or loss of balance. ? E - Eyes. Signs are trouble seeing or a sudden change in vision. ? F - Face. Signs are sudden weakness or numbness of the face, or the face or eyelid drooping on one side. ? A - Arms. Signs are weakness or numbness in an arm. This happens suddenly and usually on one side of the body. ? S - Speech. Signs are sudden trouble speaking, slurred speech, or trouble understanding what people say. ? T - Time. Time to call emergency services. Write down what time symptoms started. These  symptoms may represent a serious problem that is an emergency. Do not wait to see if the symptoms will go away. Get medical help right away. Call your local emergency services (911 in the [place] Do not drive yourself to the hospital. Summary  Familial hypercholesterolemia (FH) is a genetic disorder that causes a very high level of LDL (low-density lipoprotein) cholesterol.  FH increases your risk for coronary heart disease and stroke at an early age.  Treatment for FH should be started as soon as you are diagnosed with the condition. Treatment is aimed at lowering your risk for complications.  Follow instructions from your health care provider about eating a healthy diet. Your health care provider may recommend eating less fat and cholesterol. This information is not intended to replace advice given to you by your health care provider. Make sure you discuss any questions you have with your health care provider. Document Released: 12/01/2016 Document Revised: 12/01/2016  Document Reviewed: 12/01/2016 Elsevier Interactive Patient Education  2019 [place] Eating Plan DASH stands for "Dietary Approaches to Stop Hypertension." The DASH eating plan is a healthy eating plan that has been shown to reduce high blood pressure (hypertension). It may also reduce your risk for type 2 diabetes, heart disease, and stroke. The DASH eating plan may also help with weight loss. What are tips for following this plan?  General guidelines  Avoid eating more than 2,300 mg (milligrams) of salt (sodium) a day. If you have hypertension, you may need to reduce your sodium intake to 1,500 mg a day.  Limit alcohol intake to no more than 1 drink a day for nonpregnant women and 2 drinks a day for men. One drink equals 12 oz of beer, 5 oz of wine, or 1 oz of hard liquor.  Work with your health care provider to maintain a healthy body weight or to lose weight. Ask what an ideal weight is for you.  Get at  least 30 minutes of exercise that causes your heart to beat faster (aerobic exercise) most days of the week. Activities may include walking, swimming, or biking.  Work with your health care provider or diet and nutrition specialist (dietitian) to adjust your eating plan to your individual calorie needs. Reading food labels   Check food labels for the amount of sodium per serving. Choose foods with less than 5 percent of the Daily Value of sodium. Generally, foods with less than 300 mg of sodium per serving fit into this eating plan.  To find whole grains, look for the word "whole" as the first word in the ingredient list. Shopping  Buy products labeled as "low-sodium" or "no salt added."  Buy fresh foods. Avoid canned foods and premade or frozen meals. Cooking  Avoid adding salt when cooking. Use salt-free seasonings or herbs instead of table salt or sea salt. Check with your health care provider or pharmacist before using salt substitutes.  Do not fry foods. Cook foods using healthy methods such as baking, boiling, grilling, and broiling instead.  Cook with heart-healthy oils, such as olive, canola, soybean, or sunflower oil. Meal planning  Eat a balanced diet that includes: ? 5 or more servings of fruits and vegetables each day. At each meal, try to fill half of your plate with fruits and vegetables. ? Up to 6-8 servings of whole grains each day. ? Less than 6 oz of lean meat, poultry, or fish each day. A 3-oz serving of meat is about the same size as a deck of cards. One egg equals 1 oz. ? 2 servings of low-fat dairy each day. ? A serving of nuts, seeds, or beans 5 times each week. ? Heart-healthy fats. Healthy fats called Omega-3 fatty acids are found in foods such as flaxseeds and coldwater fish, like sardines, salmon, and mackerel.  Limit how much you eat of the following: ? Canned or prepackaged foods. ? Food that is high in trans fat, such as fried foods. ? Food that is high  in saturated fat, such as fatty meat. ? Sweets, desserts, sugary drinks, and other foods with added sugar. ? Full-fat dairy products.  Do not salt foods before eating.  Try to eat at least 2 vegetarian meals each week.  Eat more home-cooked food and less restaurant, buffet, and fast food.  When eating at a restaurant, ask that your food be prepared with less salt or no salt, if possible. What foods are recommended?  The items listed may not be a complete list. Talk with your dietitian about what dietary choices are best for you. Grains Whole-grain or whole-wheat bread. Whole-grain or whole-wheat pasta. Brown rice. [name]. Bulgur. Whole-grain and low-sodium cereals. Pita bread. Low-fat, low-sodium crackers. Whole-wheat flour tortillas. Vegetables Fresh or frozen vegetables (raw, steamed, roasted, or grilled). Low-sodium or reduced-sodium tomato and vegetable juice. Low-sodium or reduced-sodium tomato sauce and tomato paste. Low-sodium or reduced-sodium canned vegetables. Fruits All fresh, dried, or frozen fruit. Canned fruit in natural juice (without added sugar). Meat and other protein foods Skinless chicken or [place]. Ground chicken or [place]. Pork with fat trimmed off. Fish and seafood. Egg whites. Dried beans, peas, or lentils. Unsalted nuts, nut butters, and seeds. Unsalted canned beans. Lean cuts of beef with fat trimmed off. Low-sodium, lean deli meat. Dairy Low-fat (1%) or fat-free (skim) milk. Fat-free, low-fat, or reduced-fat cheeses. Nonfat, low-sodium ricotta or cottage cheese. Low-fat or nonfat yogurt. Low-fat, low-sodium cheese. Fats and oils Soft margarine without trans fats. Vegetable oil. Low-fat, reduced-fat, or light mayonnaise and salad dressings (reduced-sodium). Canola, safflower, olive, soybean, and sunflower oils. Avocado. Seasoning and other foods Herbs. Spices. Seasoning mixes without salt. Unsalted popcorn and pretzels. Fat-free sweets. What foods are not  recommended? The items listed may not be a complete list. Talk with your dietitian about what dietary choices are best for you. Grains Baked goods made with fat, such as croissants, muffins, or some breads. Dry pasta or rice meal packs. Vegetables Creamed or fried vegetables. Vegetables in a cheese sauce. Regular canned vegetables (not low-sodium or reduced-sodium). Regular canned tomato sauce and paste (not low-sodium or reduced-sodium). Regular tomato and vegetable juice (not low-sodium or reduced-sodium). Angie Fava. Olives. Fruits Canned fruit in a light or heavy syrup. Fried fruit. Fruit in cream or butter sauce. Meat and other protein foods Fatty cuts of meat. Ribs. Fried meat. Berniece Salines. Sausage. Bologna and other processed lunch meats. Salami. Fatback. Hotdogs. Bratwurst. Salted nuts and seeds. Canned beans with added salt. Canned or smoked fish. Whole eggs or egg yolks. Chicken or [place] with skin. Dairy Whole or 2% milk, cream, and half-and-half. Whole or full-fat cream cheese. Whole-fat or sweetened yogurt. Full-fat cheese. Nondairy creamers. Whipped toppings. Processed cheese and cheese spreads. Fats and oils Butter. Stick margarine. Lard. Shortening. Ghee. Bacon fat. Tropical oils, such as coconut, palm kernel, or palm oil. Seasoning and other foods Salted popcorn and pretzels. Onion salt, garlic salt, seasoned salt, table salt, and sea salt. Worcestershire sauce. Tartar sauce. Barbecue sauce. Teriyaki sauce. Soy sauce, including reduced-sodium. Steak sauce. Canned and packaged gravies. Fish sauce. Oyster sauce. Cocktail sauce. Horseradish that you find on the shelf. Ketchup. Mustard. Meat flavorings and tenderizers. Bouillon cubes. Hot sauce and Tabasco sauce. Premade or packaged marinades. Premade or packaged taco seasonings. Relishes. Regular salad dressings. Where to find more information:  National Heart, Lung, and [place]: https://wilson-eaton.com/  American Heart Association:  www.heart.org Summary  The DASH eating plan is a healthy eating plan that has been shown to reduce high blood pressure (hypertension). It may also reduce your risk for type 2 diabetes, heart disease, and stroke.  With the DASH eating plan, you should limit salt (sodium) intake to 2,300 mg a day. If you have hypertension, you may need to reduce your sodium intake to 1,500 mg a day.  When on the DASH eating plan, aim to eat more fresh fruits and vegetables, whole grains, lean proteins, low-fat dairy, and heart-healthy fats.  Work with your health care  provider or diet and nutrition specialist (dietitian) to adjust your eating plan to your individual calorie needs. This information is not intended to replace advice given to you by your health care provider. Make sure you discuss any questions you have with your health care provider. Document Released: 06/09/2011 Document Revised: 06/13/2016 Document Reviewed: 06/13/2016 Elsevier Interactive Patient Education  2019 [employer].

## 2018-07-09 ENCOUNTER — Encounter: Payer: Self-pay | Admitting: Family Medicine

## 2018-09-11 ENCOUNTER — Ambulatory Visit: Payer: BLUE CROSS/BLUE SHIELD | Admitting: Family Medicine

## 2018-10-30 ENCOUNTER — Ambulatory Visit: Payer: BLUE CROSS/BLUE SHIELD | Admitting: Family Medicine

## 2018-10-31 ENCOUNTER — Encounter: Payer: Self-pay | Admitting: Family Medicine

## 2019-01-21 ENCOUNTER — Other Ambulatory Visit: Payer: Self-pay

## 2019-01-21 DIAGNOSIS — Z20822 Contact with and (suspected) exposure to covid-19: Secondary | ICD-10-CM

## 2019-01-25 ENCOUNTER — Telehealth: Payer: Self-pay

## 2019-01-25 LAB — SPECIMEN STATUS REPORT

## 2019-01-25 LAB — NOVEL CORONAVIRUS, NAA: SARS-CoV-2, NAA: DETECTED — AB

## 2019-01-25 NOTE — Telephone Encounter (Signed)
LVM for a return call to get covid results.

## 2019-01-29 NOTE — Telephone Encounter (Signed)
Spoke with patient and informed him of his positive COVID results from 01/21/2019. Patient stated that he was informed to have COVID testing completed by his PCP here. He states that this was after his wife was screened and told to be tested. He informed me that he has been asymptomatic the entire time and was only tested from known exposure from his wife, whom was exposed by his son's girlfriend. He denies any current symptoms at this time. Advised him that he is to quarantine for 10 days from the [place] test date since he is asymptomatic. He verbalized understanding. He is scheduled for a telephone visit on tomorrow.

## 2019-01-29 NOTE — Progress Notes (Signed)
Thanks! Great.  No need for him to retest.  Out of work until 01/31/2019 while asymptomatic is the CDC criteria, thanks

## 2019-01-29 NOTE — Progress Notes (Signed)
I did not speak with him, I only did telephone visit with Sharyn Lull.  Thanks, I see him scheduled for tomorrow.

## 2019-01-29 NOTE — Progress Notes (Signed)
And for COVID + w/o symptoms, their isolation timeline is different, he must isolate at home for 10 days from test date - we need to notify him of this and document to prevent spread of illness.  (per CDC coronoavirus 2019-ncov if-you-are-sick/isolation page)

## 2019-01-30 ENCOUNTER — Other Ambulatory Visit: Payer: Self-pay

## 2019-01-30 ENCOUNTER — Encounter: Payer: Self-pay | Admitting: Family Medicine

## 2019-01-30 ENCOUNTER — Ambulatory Visit (INDEPENDENT_AMBULATORY_CARE_PROVIDER_SITE_OTHER): Payer: BC Managed Care – PPO | Admitting: Family Medicine

## 2019-01-30 ENCOUNTER — Other Ambulatory Visit: Payer: Self-pay | Admitting: Family Medicine

## 2019-01-30 DIAGNOSIS — U071 COVID-19: Secondary | ICD-10-CM | POA: Diagnosis not present

## 2019-01-30 DIAGNOSIS — R6889 Other general symptoms and signs: Secondary | ICD-10-CM | POA: Diagnosis not present

## 2019-01-30 DIAGNOSIS — Z20822 Contact with and (suspected) exposure to covid-19: Secondary | ICD-10-CM

## 2019-01-30 NOTE — Progress Notes (Signed)
Patient ID: Jason Leonard, male    DOB: [birth year], 48 y.o.   MRN: [id number]  PCP: Delsa Grana, PA-C  Virtual Visit via telephone  Phone visit arranged with Lars Masson for 01/30/19 at 11:00 AM EDT  Services provided today were via telemedicine through telephone call. Start of phone call:  11:06 AM  Called and left VM - no answer Pt returned call @ 11:09 am  I verified that I was speaking with the correct person using two identifiers. Patient reported their location during encounter was at home   Patient consented to telephone visit  I conducted telephone visit from [facility]  Referring Provider:   Delsa Grana, PA-C   All participants in encounter:  Myself and the patient   I discussed the limitations, risks, security and privacy concerns of performing an evaluation and management service by telephone and the availability of in person appointments. I also discussed with the patient that there may be a patient responsible charge related to this service. The patient expressed understanding and agreed to proceed.  No chief complaint on file.   Subjective:   Jason Leonard is a 48 y.o. male, presents via phone call for COVID + test.  He has been asymptomatic.  His was was symptomatic and COVID + and another family member was as well. Pt denies sore throat, nasal sx, cough, SOB, body aches, fever, fatigue.  He went for testing again today although he was not instructed to by [place], his work does not want him to return to work until 8/3 and wants proof of negative testing.    Patient Active Problem List   Diagnosis Date Noted  . CKD stage 4 secondary to hypertension ([place]) 07/03/2018  . Hypertension 06/14/2018  . Hyperlipidemia 06/14/2018    Prior to Admission medications   Medication Sig Start Date End Date Taking? Authorizing Provider  amLODipine-benazepril (LOTREL) 5-40 MG capsule Take 1 capsule by mouth daily. 07/03/18   Delsa Grana, PA-C   lisinopril (PRINIVIL,ZESTRIL) 20 MG tablet Take 1 tablet (20 mg total) by mouth daily. 06/12/18   Delsa Grana, PA-C  magnesium 30 MG tablet Take 30 mg by mouth 2 (two) times daily.    [provider]  Omega 3-6-9 Fatty Acids (OMEGA 3-6-9 COMPLEX PO) Take by mouth.    [provider]  simvastatin (ZOCOR) 20 MG tablet Take 1 tablet (20 mg total) by mouth at bedtime. 06/14/18   Delsa Grana, PA-C    No Known Allergies  Review of Systems  Constitutional: Negative.   HENT: Negative.   Eyes: Negative.   Respiratory: Negative.   Cardiovascular: Negative.   Gastrointestinal: Negative.   Endocrine: Negative.   Genitourinary: Negative.   Musculoskeletal: Negative.   Skin: Negative.   Allergic/Immunologic: Negative.   Neurological: Negative.   Hematological: Negative.   Psychiatric/Behavioral: Negative.   All other systems reviewed and are negative.      Objective:    There were no vitals filed for this visit.    Physical Exam  Normal phonation, alert, no audible wheeze or stridor Limited due to [place] telephone encounter     Assessment & Plan:     ICD-10-CM   1. COVID-19 virus detected  U07.1    asx COVID + after home exposure to wife also COVID +, RTW per CDC COVID + asx criteria is pt can return 01/31/2019 - letter sent     I discussed the assessment and treatment plan with the patient. The patient  was provided an opportunity to ask questions and all were answered. The patient agreed with the plan and demonstrated an understanding of the instructions.   The patient was advised to call back if he needed additional help or documentation for returning to work.  I advised that his repeat test may be positive again and I would still return him to work on 7/30.  Phone call concluded at 11:17 I provided 8 minutes of non-face-to-face time during this encounter.  Delsa Grana, PA-C 01/30/19 10:50 AM

## 2019-02-01 LAB — NOVEL CORONAVIRUS, NAA: SARS-CoV-2, NAA: NOT DETECTED

## 2019-04-02 ENCOUNTER — Encounter: Payer: Self-pay | Admitting: Family Medicine

## 2019-04-16 ENCOUNTER — Other Ambulatory Visit: Payer: Self-pay

## 2019-04-16 DIAGNOSIS — Z20822 Contact with and (suspected) exposure to covid-19: Secondary | ICD-10-CM

## 2019-04-18 LAB — NOVEL CORONAVIRUS, NAA: SARS-CoV-2, NAA: NOT DETECTED

## 2019-07-11 ENCOUNTER — Other Ambulatory Visit: Payer: Self-pay | Admitting: Family Medicine

## 2019-07-11 DIAGNOSIS — I10 Essential (primary) hypertension: Secondary | ICD-10-CM

## 2019-07-23 ENCOUNTER — Ambulatory Visit: Payer: Managed Care, Other (non HMO) | Attending: Internal Medicine

## 2019-07-23 DIAGNOSIS — Z20822 Contact with and (suspected) exposure to covid-19: Secondary | ICD-10-CM

## 2019-07-24 LAB — NOVEL CORONAVIRUS, NAA: SARS-CoV-2, NAA: NOT DETECTED

## 2019-08-13 ENCOUNTER — Other Ambulatory Visit: Payer: Self-pay | Admitting: Family Medicine

## 2019-08-13 ENCOUNTER — Encounter: Payer: Self-pay | Admitting: Family Medicine

## 2019-08-13 NOTE — Telephone Encounter (Signed)
Please schedule appt

## 2019-08-13 NOTE — Telephone Encounter (Signed)
lvm for scheduling °

## 2019-08-15 ENCOUNTER — Other Ambulatory Visit: Payer: Self-pay

## 2019-08-15 ENCOUNTER — Encounter: Payer: Self-pay | Admitting: Family Medicine

## 2019-08-15 ENCOUNTER — Ambulatory Visit (INDEPENDENT_AMBULATORY_CARE_PROVIDER_SITE_OTHER): Payer: Managed Care, Other (non HMO) | Admitting: Family Medicine

## 2019-08-15 VITALS — BP 124/70 | HR 88 | Temp 96.9°F | Resp 16 | Ht 68.0 in | Wt 230.5 lb

## 2019-08-15 DIAGNOSIS — I10 Essential (primary) hypertension: Secondary | ICD-10-CM | POA: Diagnosis not present

## 2019-08-15 DIAGNOSIS — I129 Hypertensive chronic kidney disease with stage 1 through stage 4 chronic kidney disease, or unspecified chronic kidney disease: Secondary | ICD-10-CM

## 2019-08-15 DIAGNOSIS — Z5181 Encounter for therapeutic drug level monitoring: Secondary | ICD-10-CM | POA: Diagnosis not present

## 2019-08-15 DIAGNOSIS — E782 Mixed hyperlipidemia: Secondary | ICD-10-CM

## 2019-08-15 DIAGNOSIS — N184 Chronic kidney disease, stage 4 (severe): Secondary | ICD-10-CM

## 2019-08-15 LAB — COMPLETE METABOLIC PANEL WITH GFR
AG Ratio: 1.7 (calc) (ref 1.0–2.5)
ALT: 23 U/L (ref 9–46)
AST: 31 U/L (ref 10–40)
Albumin: 4.5 g/dL (ref 3.6–5.1)
Alkaline phosphatase (APISO): 37 U/L (ref 36–130)
BUN/Creatinine Ratio: 14 (calc) (ref 6–22)
BUN: 20 mg/dL (ref 7–25)
CO2: 30 mmol/L (ref 20–32)
Calcium: 9.4 mg/dL (ref 8.6–10.3)
Chloride: 104 mmol/L (ref 98–110)
Creat: 1.44 mg/dL — ABNORMAL HIGH (ref 0.60–1.35)
GFR, Est African American: 66 mL/min/{1.73_m2} (ref >=60)
GFR, Est Non African American: 57 mL/min/{1.73_m2} — ABNORMAL LOW (ref >=60)
Globulin: 2.7 g/dL (calc) (ref 1.9–3.7)
Glucose, Bld: 88 mg/dL (ref 65–99)
Potassium: 4 mmol/L (ref 3.5–5.3)
Sodium: 140 mmol/L (ref 135–146)
Total Bilirubin: 0.7 mg/dL (ref 0.2–1.2)
Total Protein: 7.2 g/dL (ref 6.1–8.1)

## 2019-08-15 LAB — MAGNESIUM: Magnesium: 2.1 mg/dL (ref 1.5–2.5)

## 2019-08-15 LAB — CBC WITH DIFFERENTIAL/PLATELET
Absolute Monocytes: 329 cells/uL (ref 200–950)
Basophils Absolute: 21 cells/uL (ref 0–200)
Basophils Relative: 0.4 %
Eosinophils Absolute: 32 cells/uL (ref 15–500)
Eosinophils Relative: 0.6 %
HCT: 43 % (ref 38.5–50.0)
Hemoglobin: 14 g/dL (ref 13.2–17.1)
Lymphs Abs: 2618 cells/uL (ref 850–3900)
MCH: 27.9 pg (ref 27.0–33.0)
MCHC: 32.6 g/dL (ref 32.0–36.0)
MCV: 85.8 fL (ref 80.0–100.0)
MPV: 12.7 fL — ABNORMAL HIGH (ref 7.5–12.5)
Monocytes Relative: 6.2 %
Neutro Abs: 2300 cells/uL (ref 1500–7800)
Neutrophils Relative %: 43.4 %
Platelets: 137 10*3/uL — ABNORMAL LOW (ref 140–400)
RBC: 5.01 10*6/uL (ref 4.20–5.80)
RDW: 12.5 % (ref 11.0–15.0)
Total Lymphocyte: 49.4 %
WBC: 5.3 10*3/uL (ref 3.8–10.8)

## 2019-08-15 LAB — LIPID PANEL
Cholesterol: 155 mg/dL (ref <200)
HDL: 57 mg/dL (ref >=40)
LDL Cholesterol (Calc): 84 mg/dL (calc)
Non-HDL Cholesterol (Calc): 98 mg/dL (calc) (ref <130)
Total CHOL/HDL Ratio: 2.7 (calc) (ref <5.0)
Triglycerides: 49 mg/dL (ref <150)

## 2019-08-15 MED ORDER — SIMVASTATIN 20 MG PO TABS: 20.0000 mg | ORAL_TABLET | Freq: Every day | ORAL | 3 refills | Status: DC

## 2019-08-15 MED ORDER — LISINOPRIL 20 MG PO TABS: 20.0000 mg | ORAL_TABLET | Freq: Every day | ORAL | 3 refills | Status: DC

## 2019-08-15 NOTE — Progress Notes (Signed)
Name: Jason Leonard   MRN: [id number]    DOB: Aug 05, 1970   Date:08/15/2019       Progress Note  Chief Complaint  Patient presents with  . Medication Refill  . Hypertension     Subjective:   Jason Leonard is a 49 y.o. male, presents to clinic for routine follow up on the conditions listed above.  Hypertension:  Currently managed on lisinopril 20  Pt reports good med compliance and denies any SE.  No lightheadedness, hypotension, syncope. Blood pressure today is well controlled. BP Readings from Last 3 Encounters:  08/15/19 124/70  07/03/18 (!) 150/98  06/12/18 (!) 150/100  Pt denies CP, SOB, exertional sx, LE edema, palpitation, Ha's, visual disturbances  Hyperlipidemia: Current Medication Regimen:  Simvastatin 20 mg Last Lipids: Lab Results  Component Value Date   CHOL 195 06/12/2018   HDL 50 06/12/2018   LDLCALC 116 (H) 06/12/2018   TRIG 175 (H) 06/12/2018   CHOLHDL 3.9 06/12/2018  - Denies: Chest pain, shortness of breath, myalgias. - Documented aortic atherosclerosis? No - Risk factors for atherosclerosis: hypercholesterolemia and hypertension  HE states he's trying to be overall healthier, he is staying active, coaching,. Drinking Green tea, supplementing with ginger  CKD stage 4 Renal function recent labs:  Lab Results  Component Value Date   GFRAA 65 07/03/2018   GFRAA 67 06/12/2018    Lab Results  Component Value Date   CREATININE 1.46 (H) 07/03/2018   BUN 17 07/03/2018   NA 143 07/03/2018   K 4.1 07/03/2018   CL 104 07/03/2018   CO2 31 07/03/2018     Social History   Tobacco Use  . Smoking status: Never Smoker  . Smokeless tobacco: Never Used  Substance Use Topics  . Alcohol use: No  . Drug use: No    Patient Active Problem List   Diagnosis Date Noted  . CKD stage 4 secondary to hypertension ([place]) 07/03/2018  . Hypertension 06/14/2018  . Hyperlipidemia 06/14/2018    Past Surgical History:  Procedure Laterality Date  .  ARTHROSCOPIC REPAIR ACL      Family History  Problem Relation Age of Onset  . Hypertension Mother   . Hypertension Father   . Lupus Sister   . Diabetes Maternal Grandfather   . Cancer Maternal Grandfather   . Cancer Paternal Grandmother        Current Outpatient Medications:  .  amLODipine-benazepril (LOTREL) 5-40 MG capsule, Take 1 capsule by mouth daily., Disp: 30 capsule, Rfl: 0 .  lisinopril (ZESTRIL) 20 MG tablet, Take 1 tablet by mouth once daily, Disp: 30 tablet, Rfl: 0 .  magnesium 30 MG tablet, Take 30 mg by mouth 2 (two) times daily., Disp: , Rfl:  .  Omega 3-6-9 Fatty Acids (OMEGA 3-6-9 COMPLEX PO), Take by mouth., Disp: , Rfl:  .  simvastatin (ZOCOR) 20 MG tablet, TAKE 1 TABLET BY MOUTH AT BEDTIME (Patient not taking: Reported on 08/15/2019), Disp: 30 tablet, Rfl: 0  No Known Allergies  Chart Review Today: I personally reviewed active problem list, medication list, allergies, family history, social history, health maintenance, notes from last encounter, lab results, imaging with the patient/caregiver today.   Review of Systems  Constitutional: Negative.   HENT: Negative.   Eyes: Negative.   Respiratory: Negative.   Cardiovascular: Negative.   Gastrointestinal: Negative.   Endocrine: Negative.   Genitourinary: Negative.   Musculoskeletal: Negative.   Skin: Negative.   Allergic/Immunologic: Negative.   Neurological: Negative.   Hematological: Negative.  Psychiatric/Behavioral: Negative.   All other systems reviewed and are negative.    Objective:    Vitals:   08/15/19 1112  BP: 124/70  Pulse: 88  Resp: 16  Temp: (!) 96.9 F (36.1 C)  TempSrc: Temporal  SpO2: 97%  Weight: 230 lb 8 oz (104.6 kg)  Height: 5\' 8"  (1.727 m)    Body mass index is 35.05 kg/m.  Physical Exam Vitals and nursing note reviewed.  Constitutional:      General: He is not in acute distress.    Appearance: Normal appearance. He is well-developed. He is not ill-appearing,  toxic-appearing or diaphoretic.     Interventions: Face mask in place.  HENT:     Head: Normocephalic and atraumatic.     Jaw: No trismus.     Right Ear: External ear normal.     Left Ear: External ear normal.  Eyes:     General: Lids are normal. No scleral icterus.    Conjunctiva/sclera: Conjunctivae normal.     Pupils: Pupils are equal, round, and reactive to light.  Neck:     Trachea: Trachea and phonation normal. No tracheal deviation.  Cardiovascular:     Rate and Rhythm: Normal rate and regular rhythm.     Pulses: Normal pulses.          Radial pulses are 2+ on the right side and 2+ on the left side.       Posterior tibial pulses are 2+ on the right side and 2+ on the left side.     Heart sounds: Normal heart sounds. No murmur. No friction rub. No gallop.   Pulmonary:     Effort: Pulmonary effort is normal. No respiratory distress.     Breath sounds: Normal breath sounds. No stridor. No wheezing, rhonchi or rales.  Abdominal:     General: Bowel sounds are normal. There is no distension.     Palpations: Abdomen is soft.     Tenderness: There is no abdominal tenderness. There is no guarding or rebound.  Musculoskeletal:        General: Normal range of motion.     Cervical back: Normal range of motion and neck supple.     Right lower leg: No edema.     Left lower leg: No edema.  Skin:    General: Skin is warm and dry.     Capillary Refill: Capillary refill takes less than 2 seconds.     Coloration: Skin is not jaundiced.     Findings: No rash.     Nails: There is no clubbing.  Neurological:     Mental Status: He is alert.     Cranial Nerves: No dysarthria or facial asymmetry.     Motor: No tremor or abnormal muscle tone.     Gait: Gait normal.  Psychiatric:        Mood and Affect: Mood normal.        Speech: Speech normal.        Behavior: Behavior normal. Behavior is cooperative.      PHQ2/9: Depression screen [facility] 2/9 08/15/2019 07/03/2018  Decreased Interest 0 0   Down, Depressed, Hopeless 0 0  PHQ - 2 Score 0 0  Altered sleeping 0 0  Tired, decreased energy 0 0  Change in appetite 0 0  Feeling bad or failure about yourself  0 0  Trouble concentrating 0 0  Moving slowly or fidgety/restless 0 0  Suicidal thoughts 0 0  PHQ-9 Score 0 0  Difficult doing work/chores Not difficult at all Not difficult at all    phq 9 is neg, reviewed today  Fall Risk: Fall Risk  08/15/2019  Falls in the past year? 0  Number falls in past yr: 0  Injury with Fall? 0    Functional Status Survey: Is the patient deaf or have difficulty hearing?: No Does the patient have difficulty seeing, even when wearing glasses/contacts?: Yes Does the patient have difficulty concentrating, remembering, or making decisions?: No Does the patient have difficulty walking or climbing stairs?: No Does the patient have difficulty dressing or bathing?: No Does the patient have difficulty doing errands alone such as visiting a doctor's office or shopping?: No   Assessment & Plan:   1. Hypertension, unspecified type Stable, well controlled BP, check labs - COMPLETE METABOLIC PANEL WITH GFR - lisinopril (ZESTRIL) 20 MG tablet; Take 1 tablet (20 mg total) by mouth daily.  Dispense: 90 tablet; Refill: 3  2. Mixed hyperlipidemia Encouraged diet efforts, handout given, reviewed ASCVD risk calculation  - COMPLETE METABOLIC PANEL WITH GFR - Lipid panel - simvastatin (ZOCOR) 20 MG tablet; Take 1 tablet (20 mg total) by mouth at bedtime.  Dispense: 90 tablet; Refill: 3  3. CKD stage 4 secondary to hypertension ([place]) Recheck, hydrate, avoid NSAIDs continue to control BP - COMPLETE METABOLIC PANEL WITH GFR  4. Encounter for medication monitoring - CBC with Differential/Platelet - COMPLETE METABOLIC PANEL WITH GFR - Lipid panel - Magnesium  5. Hypomagnesemia Not currently supplementing, recheck labs - Magnesium  Return in about 6 months (around 02/12/2020) for Routine follow-up.    Delsa Grana, PA-C 08/15/19 11:34 AM

## 2019-08-20 ENCOUNTER — Ambulatory Visit: Payer: Managed Care, Other (non HMO) | Attending: Internal Medicine

## 2019-08-20 DIAGNOSIS — Z20822 Contact with and (suspected) exposure to covid-19: Secondary | ICD-10-CM

## 2019-08-21 LAB — NOVEL CORONAVIRUS, NAA: SARS-CoV-2, NAA: NOT DETECTED

## 2019-12-10 ENCOUNTER — Other Ambulatory Visit: Payer: Self-pay

## 2019-12-10 ENCOUNTER — Encounter: Payer: Self-pay | Admitting: Family Medicine

## 2019-12-10 ENCOUNTER — Ambulatory Visit (INDEPENDENT_AMBULATORY_CARE_PROVIDER_SITE_OTHER): Payer: Managed Care, Other (non HMO) | Admitting: Family Medicine

## 2019-12-10 VITALS — BP 132/86 | HR 74 | Temp 98.1°F | Resp 14 | Ht 68.0 in | Wt 222.3 lb

## 2019-12-10 DIAGNOSIS — Z1329 Encounter for screening for other suspected endocrine disorder: Secondary | ICD-10-CM

## 2019-12-10 DIAGNOSIS — Z1211 Encounter for screening for malignant neoplasm of colon: Secondary | ICD-10-CM | POA: Diagnosis not present

## 2019-12-10 DIAGNOSIS — Z5181 Encounter for therapeutic drug level monitoring: Secondary | ICD-10-CM

## 2019-12-10 DIAGNOSIS — Z13228 Encounter for screening for other metabolic disorders: Secondary | ICD-10-CM

## 2019-12-10 DIAGNOSIS — Z0000 Encounter for general adult medical examination without abnormal findings: Secondary | ICD-10-CM

## 2019-12-10 DIAGNOSIS — I10 Essential (primary) hypertension: Secondary | ICD-10-CM

## 2019-12-10 DIAGNOSIS — E782 Mixed hyperlipidemia: Secondary | ICD-10-CM

## 2019-12-10 DIAGNOSIS — Z130 Encounter for screening for diseases of the blood and blood-forming organs and certain disorders involving the immune mechanism: Secondary | ICD-10-CM

## 2019-12-10 NOTE — Progress Notes (Signed)
Patient: Jason Leonard, Male    DOB: 1971/03/24, 49 y.o.   MRN: [id number] Delsa Grana, PA-C Visit Date: 12/10/2019  Today's Provider: Delsa Grana, PA-C   Chief Complaint  Patient presents with  . Annual Exam   Subjective:   Annual physical exam:  Jason Leonard is a 49 y.o. male who presents today for health maintenance and annual & complete physical exam.  He feels well.  He reports exercising - not exercising as much since covid. Diet - eats eggs, [place] burgers salmon, salads, chicken, tuna He reports he is sleeping well - sleeps well, third shift   Hypertension:  Currently managed on lisinopril 20 mg daily Pt reports good med compliance and denies any SE.  No lightheadedness, hypotension, syncope. Blood pressure today is well controlled. BP Readings from Last 3 Encounters:  12/10/19 132/86  08/15/19 124/70  07/03/18 (!) 150/98   Pt denies CP, SOB, exertional sx, LE edema, palpitation, Ha's, visual disturbances Dietary efforts for BP?  Healthy diet pushes fluids, avoid salt and carbs   Hyperlipidemia: Current Medication Regimen: Simvastatin 20 mg daily he forgets a dose occasionally about once a month, and no myalgias, side effects or concerns Last Lipids: Lab Results  Component Value Date   CHOL 155 08/15/2019   HDL 57 08/15/2019   LDLCALC 84 08/15/2019   TRIG 49 08/15/2019   CHOLHDL 2.7 08/15/2019   - Current Diet: See above - Denies: Chest pain, shortness of breath, myalgias. - Documented aortic atherosclerosis? No - Risk factors for atherosclerosis: hypercholesterolemia and hypertension  Last labs showed an increase in serum creatinine and decrease in GFR, patient drinks ample water, controlled blood pressure and avoids NSAIDs He also had low platelets  USPSTF grade A and B recommendations - reviewed and addressed today  Depression:  Phq 9 completed today by patient, was reviewed by me with patient in the room, score is  negative, pt feels good PHQ  2/9 Scores 12/10/2019 08/15/2019 07/03/2018  PHQ - 2 Score 0 0 0  PHQ- 9 Score 0 0 0   Depression screen [facility] 2/9 12/10/2019 08/15/2019 07/03/2018  Decreased Interest 0 0 0  Down, Depressed, Hopeless 0 0 0  PHQ - 2 Score 0 0 0  Altered sleeping 0 0 0  Tired, decreased energy 0 0 0  Change in appetite 0 0 0  Feeling bad or failure about yourself  0 0 0  Trouble concentrating 0 0 0  Moving slowly or fidgety/restless 0 0 0  Suicidal thoughts 0 0 0  PHQ-9 Score 0 0 0  Difficult doing work/chores Not difficult at all Not difficult at all Not difficult at all    Hep C Screening: not indicated  STD testing and prevention (HIV/chl/gon/syphilis):  Not needed Prostate cancer: pt asks about PSA screening  Lab Results  Component Value Date   PSA 1.5 06/12/2018   Intimate partner violence:  none  Urinary Symptoms:  IPSS Questionnaire (AUA-7): Over the past month.   1)  How often have you had a sensation of not emptying your bladder completely after you finish urinating?  0 - Not at all  2)  How often have you had to urinate again less than two hours after you finished urinating? 0 - Not at all  3)  How often have you found you stopped and started again several times when you urinated?  0 - Not at all  4) How difficult have you found it to postpone urination?  0 - Not  at all  5) How often have you had a weak urinary stream?  0 - Not at all  6) How often have you had to push or strain to begin urination?  0 - Not at all  7) How many times did you most typically get up to urinate from the time you went to bed until the time you got up in the morning?  0 - None  Total score:  0-7 mildly symptomatic   8-19 moderately symptomatic   20-35 severely symptomatic      Skin cancer: Pt reports no hx of skin cancer, suspicious lesions/biopsies in the past.  No current concerns  Colorectal cancer:  colonoscopy is due with new recommendation for colorectal cancer screening at 49 years old Patient denies  any melena, hematochezia or change in bowel movements or caliber of stool, no known family history of colorectal cancer   Lung cancer:  Low Dose CT Chest recommended if Age 63-80 years, 30 pack-year currently smoking OR have quit w/in 15years. Patient does not qualify.   Social History   Tobacco Use  . Smoking status: Never Smoker  . Smokeless tobacco: Never Used  Substance Use Topics  . Alcohol use: No     Alcohol screening:   Office Visit from 12/10/2019 in [facility]  AUDIT-C Score  0      AAA:  The USPSTF recommends one-time screening with ultrasonography in men ages 48 to 32 years who have ever smoked  ECG:    Blood pressure/Hypertension: BP Readings from Last 3 Encounters:  12/10/19 132/86  08/15/19 124/70  07/03/18 (!) 150/98   Weight/Obesity: Wt Readings from Last 3 Encounters:  12/10/19 222 lb 4.8 oz (100.8 kg)  08/15/19 230 lb 8 oz (104.6 kg)  07/03/18 226 lb 6 oz (102.7 kg)   BMI Readings from Last 3 Encounters:  12/10/19 33.80 kg/m  08/15/19 35.05 kg/m  07/03/18 34.42 kg/m    Lipids:  Lab Results  Component Value Date   CHOL 155 08/15/2019   CHOL 195 06/12/2018   Lab Results  Component Value Date   HDL 57 08/15/2019   HDL 50 06/12/2018   Lab Results  Component Value Date   LDLCALC 84 08/15/2019   LDLCALC 116 (H) 06/12/2018   Lab Results  Component Value Date   TRIG 49 08/15/2019   TRIG 175 (H) 06/12/2018   Lab Results  Component Value Date   CHOLHDL 2.7 08/15/2019   CHOLHDL 3.9 06/12/2018   No results found for: LDLDIRECT Based on the results of lipid panel his/her cardiovascular risk factor ( using [place] )  in the next 10 years is : The 10-year ASCVD risk score Mikey Bussing DC Brooke Bonito., et al., 2013) is: 7.9%   Values used to calculate the score:     Age: 39 years     Sex: Male     Is Non-Hispanic African American: Yes     Diabetic: No     Tobacco smoker: No     Systolic Blood Pressure: 546 mmHg     Is BP  treated: Yes     HDL Cholesterol: 57 mg/dL     Total Cholesterol: 155 mg/dL Glucose:  Glucose, Bld  Date Value Ref Range Status  08/15/2019 88 65 - 99 mg/dL Final    Comment:    .            Fasting reference interval .   07/03/2018 93 65 - 99 mg/dL Final  Comment:    .            Fasting reference interval .   06/12/2018 79 65 - 99 mg/dL Final    Comment:    .            Fasting reference interval .     Social History      He  reports that he has never smoked. He has never used smokeless tobacco. He reports that he does not drink alcohol or use drugs.       Social History   Socioeconomic History  . Marital status: Married    Spouse name: Not on file  . Number of children: 2  . Years of education: Not on file  . Highest education level: Not on file  Occupational History  . Not on file  Tobacco Use  . Smoking status: Never Smoker  . Smokeless tobacco: Never Used  Substance and Sexual Activity  . Alcohol use: No  . Drug use: No  . Sexual activity: Yes    Comment: one partner wife - 2011  Other Topics Concern  . Not on file  Social History Narrative  . Not on file   Social Determinants of Health   Financial Resource Strain:   . Difficulty of Paying Living Expenses:   Food Insecurity:   . Worried About Charity fundraiser in the Last Year:   . Arboriculturist in the Last Year:   Transportation Needs:   . Film/video editor (Medical):   Marland Kitchen Lack of Transportation (Non-Medical):   Physical Activity:   . Days of Exercise per Week:   . Minutes of Exercise per Session:   Stress:   . Feeling of Stress :   Social Connections:   . Frequency of Communication with Friends and Family:   . Frequency of Social Gatherings with Friends and Family:   . Attends Religious Services:   . Active Member of Clubs or Organizations:   . Attends Archivist Meetings:   Marland Kitchen Marital Status:          Social History   Socioeconomic History  . Marital status:  Married    Spouse name: Not on file  . Number of children: 2  . Years of education: Not on file  . Highest education level: Not on file  Occupational History  . Not on file  Tobacco Use  . Smoking status: Never Smoker  . Smokeless tobacco: Never Used  Substance and Sexual Activity  . Alcohol use: No  . Drug use: No  . Sexual activity: Yes    Comment: one partner wife - 2011  Other Topics Concern  . Not on file  Social History Narrative  . Not on file   Social Determinants of Health   Financial Resource Strain:   . Difficulty of Paying Living Expenses:   Food Insecurity:   . Worried About Charity fundraiser in the Last Year:   . Arboriculturist in the Last Year:   Transportation Needs:   . Film/video editor (Medical):   Marland Kitchen Lack of Transportation (Non-Medical):   Physical Activity:   . Days of Exercise per Week:   . Minutes of Exercise per Session:   Stress:   . Feeling of Stress :   Social Connections:   . Frequency of Communication with Friends and Family:   . Frequency of Social Gatherings with Friends and Family:   . Attends Religious Services:   .  Active Member of Clubs or Organizations:   . Attends Archivist Meetings:   Marland Kitchen Marital Status:   Intimate Partner Violence:   . Fear of Current or Ex-Partner:   . Emotionally Abused:   Marland Kitchen Physically Abused:   . Sexually Abused:     Family History        Family Status  Relation Name Status  . Mother  (Not Specified)  . Father  (Not Specified)  . Sister  (Not Specified)  . MGF  (Not Specified)  . PGM  (Not Specified)        His family history includes Cancer in his maternal grandfather and paternal grandmother; Diabetes in his maternal grandfather; Hypertension in his father and mother; Lupus in his sister.       Family History  Problem Relation Age of Onset  . Hypertension Mother   . Hypertension Father   . Lupus Sister   . Diabetes Maternal Grandfather   . Cancer Maternal Grandfather   .  Cancer Paternal Grandmother     Patient Active Problem List   Diagnosis Date Noted  . CKD stage 4 secondary to hypertension ([place]) 07/03/2018  . Hypertension 06/14/2018  . Hyperlipidemia 06/14/2018    Past Surgical History:  Procedure Laterality Date  . ARTHROSCOPIC REPAIR ACL       Current Outpatient Medications:  .  lisinopril (ZESTRIL) 20 MG tablet, Take 1 tablet (20 mg total) by mouth daily., Disp: 90 tablet, Rfl: 3 .  simvastatin (ZOCOR) 20 MG tablet, Take 1 tablet (20 mg total) by mouth at bedtime., Disp: 90 tablet, Rfl: 3  No Known Allergies  Patient Care Team: Delsa Grana, PA-C as PCP - General (Family Medicine)  I personally reviewed active problem list, medication list, allergies, family history, social history, health maintenance, notes from last encounter, lab results with the patient/caregiver today.  Review of Systems  Constitutional: Negative.  Negative for activity change, appetite change, fatigue and unexpected weight change.  HENT: Negative.   Eyes: Negative.   Respiratory: Negative.  Negative for shortness of breath.   Cardiovascular: Negative.  Negative for chest pain, palpitations and leg swelling.  Gastrointestinal: Negative.  Negative for abdominal pain and blood in stool.  Endocrine: Negative.   Genitourinary: Negative.  Negative for decreased urine volume, difficulty urinating, testicular pain and urgency.  Skin: Negative.  Negative for color change and pallor.  Allergic/Immunologic: Negative.   Neurological: Negative.  Negative for syncope, weakness, light-headedness and numbness.  Psychiatric/Behavioral: Negative.  Negative for confusion, dysphoric mood, self-injury and suicidal ideas. The patient is not nervous/anxious.   All other systems reviewed and are negative.         Objective:   Vitals:  Vitals:   12/10/19 1052  BP: 132/86  Pulse: 74  Resp: 14  Temp: 98.1 F (36.7 C)  SpO2: 97%  Weight: 222 lb 4.8 oz (100.8 kg)  Height: 5'  8" (1.727 m)    Body mass index is 33.8 kg/m.  Physical Exam   No results found for this or any previous visit (from the past 2160 hour(s)).  PHQ2/9: Depression screen [facility] 2/9 12/10/2019 08/15/2019 07/03/2018  Decreased Interest 0 0 0  Down, Depressed, Hopeless 0 0 0  PHQ - 2 Score 0 0 0  Altered sleeping 0 0 0  Tired, decreased energy 0 0 0  Change in appetite 0 0 0  Feeling bad or failure about yourself  0 0 0  Trouble concentrating 0 0 0  Moving slowly  or fidgety/restless 0 0 0  Suicidal thoughts 0 0 0  PHQ-9 Score 0 0 0  Difficult doing work/chores Not difficult at all Not difficult at all Not difficult at all    Fall Risk: Fall Risk  12/10/2019 08/15/2019  Falls in the past year? 0 0  Number falls in past yr: 0 0  Injury with Fall? 0 0    Functional Status Survey: Is the patient deaf or have difficulty hearing?: No Does the patient have difficulty seeing, even when wearing glasses/contacts?: No Does the patient have difficulty concentrating, remembering, or making decisions?: No Does the patient have difficulty walking or climbing stairs?: No Does the patient have difficulty dressing or bathing?: No Does the patient have difficulty doing errands alone such as visiting a doctor's office or shopping?: No   Assessment & Plan:    CPE completed today  . Prostate cancer screening and PSA options (with potential risks and benefits of testing vs not testing) were discussed along with recent recs/guidelines, shared decision making and handout/information given to pt today-with lack of symptoms, lack of family history and USPS TF recommendations and patient's young age we will not be repeating PSA today  . USPSTF grade A and B recommendations reviewed with patient; age-appropriate recommendations, preventive care, screening tests, etc discussed and encouraged; healthy living encouraged; see AVS for patient education given to patient  . Discussed importance of 150 minutes of  physical activity weekly  . Discussed importance of healthy diet:  eating lean meats and proteins, avoiding trans fats and saturated fats, avoid simple sugars and excessive carbs in diet, eat 6 servings of fruit/vegetables daily and drink plenty of water and avoid sweet beverages.  DASH diet reviewed if pt has HTN  . Recommended pt to do annual eye exam and routine dental exams/cleanings  . Reviewed Health Maintenance: Health Maintenance  Topic Date Due  . COVID-19 Vaccine (1) Never done  . TETANUS/TDAP  08/14/2020 (Originally 09/20/1989)  . HIV Screening  08/14/2020 (Originally 09/20/1985)  . INFLUENZA VACCINE  02/02/2020     ICD-10-CM   1. Adult general medical exam  Z00.00 CBC with Differential/Platelet    COMPLETE METABOLIC PANEL WITH GFR    Lipid panel  2. Screening for malignant neoplasm of colon  Z12.11 Ambulatory referral to Gastroenterology  3. Screening for endocrine, metabolic and immunity disorder  Z13.29    Z13.228    Z13.0   4. Encounter for medication monitoring  Z51.81   5. Hypertension, unspecified type  I10    Well-controlled  6. Mixed hyperlipidemia  E78.2    Well-controlled, tolerating statin     Delsa Grana, PA-C 12/10/19 11:12 AM  [facility]

## 2019-12-10 NOTE — Patient Instructions (Signed)
Preventive Care 40-49 Years Old, Male °Preventive care refers to lifestyle choices and visits with your health care provider that can promote health and wellness. This includes: °· A yearly physical exam. This is also called an annual well check. °· Regular dental and eye exams. °· Immunizations. °· Screening for certain conditions. °· Healthy lifestyle choices, such as eating a healthy diet, getting regular exercise, not using drugs or products that contain nicotine and tobacco, and limiting alcohol use. °What can I expect for my preventive care visit? °Physical exam °Your health care provider will check: °· Height and weight. These may be used to calculate body mass index (BMI), which is a measurement that tells if you are at a healthy weight. °· Heart rate and blood pressure. °· Your skin for abnormal spots. °Counseling °Your health care provider may ask you questions about: °· Alcohol, tobacco, and drug use. °· Emotional well-being. °· Home and relationship well-being. °· Sexual activity. °· Eating habits. °· Work and work environment. °What immunizations do I need? ° °Influenza (flu) vaccine °· This is recommended every year. °Tetanus, diphtheria, and pertussis (Tdap) vaccine °· You may need a Td booster every 10 years. °Varicella (chickenpox) vaccine °· You may need this vaccine if you have not already been vaccinated. °Zoster (shingles) vaccine °· You may need this after age 60. °Measles, mumps, and rubella (MMR) vaccine °· You may need at least one dose of MMR if you were born in 1957 or later. You may also need a second dose. °Pneumococcal conjugate (PCV13) vaccine °· You may need this if you have certain conditions and were not previously vaccinated. °Pneumococcal polysaccharide (PPSV23) vaccine °· You may need one or two doses if you smoke cigarettes or if you have certain conditions. °Meningococcal conjugate (MenACWY) vaccine °· You may need this if you have certain conditions. °Hepatitis A  vaccine °· You may need this if you have certain conditions or if you travel or work in places where you may be exposed to hepatitis A. °Hepatitis B vaccine °· You may need this if you have certain conditions or if you travel or work in places where you may be exposed to hepatitis B. °Haemophilus influenzae type b (Hib) vaccine °· You may need this if you have certain risk factors. °Human papillomavirus (HPV) vaccine °· If recommended by your health care provider, you may need three doses over 6 months. °You may receive vaccines as individual doses or as more than one vaccine together in one shot (combination vaccines). Talk with your health care provider about the risks and benefits of combination vaccines. °What tests do I need? °Blood tests °· Lipid and cholesterol levels. These may be checked every 5 years, or more frequently if you are over 50 years old. °· Hepatitis C test. °· Hepatitis B test. °Screening °· Lung cancer screening. You may have this screening every year starting at age 55 if you have a 30-pack-year history of smoking and currently smoke or have quit within the past 15 years. °· Prostate cancer screening. Recommendations will vary depending on your family history and other risks. °· Colorectal cancer screening. All adults should have this screening starting at age 50 and continuing until age 75. Your health care provider may recommend screening at age 45 if you are at increased risk. You will have tests every 1-10 years, depending on your results and the type of screening test. °· Diabetes screening. This is done by checking your blood sugar (glucose) after you have not eaten   for a while (fasting). You may have this done every 1-3 years.  Sexually transmitted disease (STD) testing. Follow these instructions at home: Eating and drinking  Eat a diet that includes fresh fruits and vegetables, whole grains, lean protein, and low-fat dairy products.  Take vitamin and mineral supplements as  recommended by your health care provider.  Do not drink alcohol if your health care provider tells you not to drink.  If you drink alcohol: ? Limit how much you have to 0-2 drinks a day. ? Be aware of how much alcohol is in your drink. In the [place] one drink equals one 12 oz bottle of beer (355 mL), one 5 oz glass of wine (148 mL), or one 1 oz glass of hard liquor (44 mL). Lifestyle  Take daily care of your teeth and gums.  Stay active. Exercise for at least 30 minutes on 5 or more days each week.  Do not use any products that contain nicotine or tobacco, such as cigarettes, e-cigarettes, and chewing tobacco. If you need help quitting, ask your health care provider.  If you are sexually active, practice safe sex. Use a condom or other form of protection to prevent STIs (sexually transmitted infections).  Talk with your health care provider about taking a low-dose aspirin every day starting at age 50. What's next?  Go to your health care provider once a year for a well check visit.  Ask your health care provider how often you should have your eyes and teeth checked.  Stay up to date on all vaccines. This information is not intended to replace advice given to you by your health care provider. Make sure you discuss any questions you have with your health care provider. Document Revised: 06/14/2018 Document Reviewed: 06/14/2018 Elsevier Patient Education  2020 [employer]vier Inc[employer]  

## 2019-12-11 LAB — COMPLETE METABOLIC PANEL WITH GFR
AG Ratio: 1.6 (calc) (ref 1.0–2.5)
ALT: 27 U/L (ref 9–46)
AST: 39 U/L (ref 10–40)
Albumin: 4.4 g/dL (ref 3.6–5.1)
Alkaline phosphatase (APISO): 40 U/L (ref 36–130)
BUN/Creatinine Ratio: 16 (calc) (ref 6–22)
BUN: 24 mg/dL (ref 7–25)
CO2: 29 mmol/L (ref 20–32)
Calcium: 9.5 mg/dL (ref 8.6–10.3)
Chloride: 105 mmol/L (ref 98–110)
Creat: 1.46 mg/dL — ABNORMAL HIGH (ref 0.60–1.35)
GFR, Est African American: 65 mL/min/{1.73_m2} (ref >=60)
GFR, Est Non African American: 56 mL/min/{1.73_m2} — ABNORMAL LOW (ref >=60)
Globulin: 2.8 g/dL (calc) (ref 1.9–3.7)
Glucose, Bld: 85 mg/dL (ref 65–99)
Potassium: 4.1 mmol/L (ref 3.5–5.3)
Sodium: 140 mmol/L (ref 135–146)
Total Bilirubin: 0.9 mg/dL (ref 0.2–1.2)
Total Protein: 7.2 g/dL (ref 6.1–8.1)

## 2019-12-11 LAB — CBC WITH DIFFERENTIAL/PLATELET
Absolute Monocytes: 317 cells/uL (ref 200–950)
Basophils Absolute: 29 cells/uL (ref 0–200)
Basophils Relative: 0.6 %
Eosinophils Absolute: 29 cells/uL (ref 15–500)
Eosinophils Relative: 0.6 %
HCT: 43.9 % (ref 38.5–50.0)
Hemoglobin: 14.1 g/dL (ref 13.2–17.1)
Lymphs Abs: 2635 cells/uL (ref 850–3900)
MCH: 28 pg (ref 27.0–33.0)
MCHC: 32.1 g/dL (ref 32.0–36.0)
MCV: 87.1 fL (ref 80.0–100.0)
MPV: 13.4 fL — ABNORMAL HIGH (ref 7.5–12.5)
Monocytes Relative: 6.6 %
Neutro Abs: 1790 cells/uL (ref 1500–7800)
Neutrophils Relative %: 37.3 %
Platelets: 126 10*3/uL — ABNORMAL LOW (ref 140–400)
RBC: 5.04 10*6/uL (ref 4.20–5.80)
RDW: 12.7 % (ref 11.0–15.0)
Total Lymphocyte: 54.9 %
WBC: 4.8 10*3/uL (ref 3.8–10.8)

## 2019-12-11 LAB — LIPID PANEL
Cholesterol: 181 mg/dL (ref <200)
HDL: 68 mg/dL (ref >=40)
LDL Cholesterol (Calc): 100 mg/dL (calc) — ABNORMAL HIGH
Non-HDL Cholesterol (Calc): 113 mg/dL (calc) (ref <130)
Total CHOL/HDL Ratio: 2.7 (calc) (ref <5.0)
Triglycerides: 52 mg/dL (ref <150)

## 2019-12-18 ENCOUNTER — Encounter: Payer: Self-pay | Admitting: *Deleted

## 2019-12-24 ENCOUNTER — Other Ambulatory Visit: Payer: Self-pay

## 2019-12-24 ENCOUNTER — Telehealth: Payer: Managed Care, Other (non HMO)

## 2019-12-24 ENCOUNTER — Telehealth (INDEPENDENT_AMBULATORY_CARE_PROVIDER_SITE_OTHER): Payer: Managed Care, Other (non HMO) | Admitting: Gastroenterology

## 2019-12-24 DIAGNOSIS — Z1211 Encounter for screening for malignant neoplasm of colon: Secondary | ICD-10-CM

## 2019-12-24 MED ORDER — NA SULFATE-K SULFATE-MG SULF 17.5-3.13-1.6 GM/177ML PO SOLN: 1.0000 | Freq: Once | ORAL | 0 refills | Status: AC

## 2019-12-24 NOTE — Progress Notes (Signed)
Gastroenterology Pre-Procedure Review  Request Date: Thursday 01/09/20 Requesting Physician: Dr. Vicente Males  PATIENT REVIEW QUESTIONS: The patient responded to the following health history questions as indicated:    1. Are you having any GI issues? no 2. Do you have a personal history of Polyps? no 3. Do you have a family history of Colon Cancer or Polyps? no 4. Diabetes Mellitus? no 5. Joint replacements in the past 12 months?no 6. Major health problems in the past 3 months?no 7. Any artificial heart valves, MVP, or defibrillator?no    MEDICATIONS & ALLERGIES:    Patient reports the following regarding taking any anticoagulation/antiplatelet therapy:   Plavix, Coumadin, Eliquis, Xarelto, Lovenox, Pradaxa, Brilinta, or Effient? no Aspirin? no  Patient confirms/reports the following medications:  Current Outpatient Medications  Medication Sig Dispense Refill  . lisinopril (ZESTRIL) 20 MG tablet Take 1 tablet (20 mg total) by mouth daily. 90 tablet 3  . simvastatin (ZOCOR) 20 MG tablet Take 1 tablet (20 mg total) by mouth at bedtime. 90 tablet 3   No current facility-administered medications for this visit.    Patient confirms/reports the following allergies:  No Known Allergies  No orders of the defined types were placed in this encounter.   AUTHORIZATION INFORMATION Primary Insurance: 1D#: Group #:  Secondary Insurance: 1D#: Group #:  SCHEDULE INFORMATION: Date: Thursday07/08/21 Time: Location:[facility]

## 2020-01-07 ENCOUNTER — Other Ambulatory Visit
Admission: RE | Admit: 2020-01-07 | Discharge: 2020-01-07 | Disposition: A | Discharge status: Home / Self Care | Payer: Managed Care, Other (non HMO) | Source: Ambulatory Visit | Attending: Gastroenterology | Admitting: Gastroenterology

## 2020-01-07 ENCOUNTER — Other Ambulatory Visit: Payer: Self-pay

## 2020-01-07 DIAGNOSIS — Z01812 Encounter for preprocedural laboratory examination: Secondary | ICD-10-CM | POA: Insufficient documentation

## 2020-01-07 DIAGNOSIS — Z20822 Contact with and (suspected) exposure to covid-19: Secondary | ICD-10-CM | POA: Insufficient documentation

## 2020-01-07 LAB — SARS CORONAVIRUS 2 (TAT 6-24 HRS): SARS Coronavirus 2: NEGATIVE

## 2020-01-09 ENCOUNTER — Encounter: Payer: Self-pay | Admitting: Gastroenterology

## 2020-01-09 ENCOUNTER — Ambulatory Visit: Payer: Managed Care, Other (non HMO) | Admitting: Certified Registered"

## 2020-01-09 ENCOUNTER — Other Ambulatory Visit: Payer: Self-pay

## 2020-01-09 ENCOUNTER — Ambulatory Visit
Admission: RE | Admit: 2020-01-09 | Discharge: 2020-01-09 | Disposition: A | Discharge status: Home / Self Care | Payer: Managed Care, Other (non HMO) | Attending: Gastroenterology | Admitting: Gastroenterology

## 2020-01-09 ENCOUNTER — Encounter
Admission: RE | Disposition: A | Discharge status: Home / Self Care | Payer: Self-pay | Source: Home / Self Care | Attending: Gastroenterology

## 2020-01-09 DIAGNOSIS — I10 Essential (primary) hypertension: Secondary | ICD-10-CM | POA: Diagnosis not present

## 2020-01-09 DIAGNOSIS — Z79899 Other long term (current) drug therapy: Secondary | ICD-10-CM | POA: Diagnosis not present

## 2020-01-09 DIAGNOSIS — Z1211 Encounter for screening for malignant neoplasm of colon: Secondary | ICD-10-CM | POA: Diagnosis present

## 2020-01-09 DIAGNOSIS — E785 Hyperlipidemia, unspecified: Secondary | ICD-10-CM | POA: Insufficient documentation

## 2020-01-09 HISTORY — DX: Essential (primary) hypertension: I10

## 2020-01-09 HISTORY — PX: COLONOSCOPY WITH PROPOFOL: SHX5780

## 2020-01-09 SURGERY — COLONOSCOPY WITH PROPOFOL
Anesthesia: General

## 2020-01-09 MED ORDER — LIDOCAINE HCL (PF) 1 % IJ SOLN
INTRAMUSCULAR | Status: AC
  Administered 2020-01-09: 0.05 mL
  Filled 2020-01-09: qty 2

## 2020-01-09 MED ORDER — GLYCOPYRROLATE 0.2 MG/ML IJ SOLN
INTRAMUSCULAR | Status: DC | PRN
  Administered 2020-01-09: .2 mg via INTRAVENOUS

## 2020-01-09 MED ORDER — PROPOFOL 500 MG/50ML IV EMUL
INTRAVENOUS | Status: DC | PRN
  Administered 2020-01-09: 165 ug/kg/min via INTRAVENOUS

## 2020-01-09 MED ORDER — MIDAZOLAM HCL 2 MG/2ML IJ SOLN
INTRAMUSCULAR | Status: DC | PRN
  Administered 2020-01-09: 2 mg via INTRAVENOUS

## 2020-01-09 MED ORDER — MIDAZOLAM HCL 2 MG/2ML IJ SOLN
INTRAMUSCULAR | Status: AC
  Filled 2020-01-09: qty 2

## 2020-01-09 MED ORDER — SODIUM CHLORIDE 0.9 % IV SOLN: INTRAVENOUS | Status: DC

## 2020-01-09 MED ORDER — LIDOCAINE HCL (CARDIAC) PF 100 MG/5ML IV SOSY
PREFILLED_SYRINGE | INTRAVENOUS | Status: DC | PRN
  Administered 2020-01-09: 100 mg via INTRAVENOUS

## 2020-01-09 MED ORDER — PROPOFOL 10 MG/ML IV BOLUS
INTRAVENOUS | Status: DC | PRN
  Administered 2020-01-09: 30 mg via INTRAVENOUS

## 2020-01-09 NOTE — H&P (Signed)
Jonathon Bellows, MD [address] [place], [place], [place], [address] [id number], [place], [place], [place], [address] Phone: [phone]  Fax: [phone]  Primary Care Physician:  Delsa Grana, PA-C   Pre-Procedure History & Physical: HPI:  Sheron Robin is a 49 y.o. male is here for an colonoscopy.   Past Medical History:  Diagnosis Date  . Hyperlipidemia    low HDL, high LDL - no meds, diet controlled  . Hypertension     Past Surgical History:  Procedure Laterality Date  . ARTHROSCOPIC REPAIR ACL    . WISDOM TOOTH EXTRACTION      Prior to Admission medications   Medication Sig Start Date End Date Taking? Authorizing Provider  lisinopril (ZESTRIL) 20 MG tablet Take 1 tablet (20 mg total) by mouth daily. 08/15/19  Yes Delsa Grana, PA-C  simvastatin (ZOCOR) 20 MG tablet Take 1 tablet (20 mg total) by mouth at bedtime. 08/15/19  Yes Delsa Grana, PA-C    Allergies as of 12/25/2019  . (No Known Allergies)    Family History  Problem Relation Age of Onset  . Hypertension Mother   . Hypertension Father   . Lupus Sister   . Diabetes Maternal Grandfather   . Cancer Maternal Grandfather   . Cancer Paternal Grandmother     Social History   Socioeconomic History  . Marital status: Married    Spouse name: Not on file  . Number of children: 2  . Years of education: Not on file  . Highest education level: Not on file  Occupational History  . Not on file  Tobacco Use  . Smoking status: Never Smoker  . Smokeless tobacco: Never Used  Vaping Use  . Vaping Use: Never used  Substance and Sexual Activity  . Alcohol use: No  . Drug use: No  . Sexual activity: Yes    Comment: one partner wife - 2011  Other Topics Concern  . Not on file  Social History Narrative  . Not on file   Social Determinants of Health   Financial Resource Strain:   . Difficulty of Paying Living Expenses:   Food Insecurity:   . Worried About Charity fundraiser in the Last Year:     . Arboriculturist in the Last Year:   Transportation Needs:   . Film/video editor (Medical):   Marland Kitchen Lack of Transportation (Non-Medical):   Physical Activity:   . Days of Exercise per Week:   . Minutes of Exercise per Session:   Stress:   . Feeling of Stress :   Social Connections:   . Frequency of Communication with Friends and Family:   . Frequency of Social Gatherings with Friends and Family:   . Attends Religious Services:   . Active Member of Clubs or Organizations:   . Attends Archivist Meetings:   Marland Kitchen Marital Status:   Intimate Partner Violence:   . Fear of Current or Ex-Partner:   . Emotionally Abused:   Marland Kitchen Physically Abused:   . Sexually Abused:     Review of Systems: See HPI, otherwise negative ROS  Physical Exam: BP (!) 160/110   Pulse (!) 55   Temp (!) 96.7 F (35.9 C) (Temporal)   Resp 16   Ht 5\' 8"  (1.727 m)   Wt 102.1 kg   SpO2 100%   BMI 34.21 kg/m  General:   Alert,  pleasant and cooperative in NAD Head:  Normocephalic and atraumatic. Neck:  Supple; no masses or thyromegaly. Lungs:  Clear throughout to auscultation, normal respiratory effort.    Heart:  +S1, +S2, Regular rate and rhythm, No edema. Abdomen:  Soft, nontender and nondistended. Normal bowel sounds, without guarding, and without rebound.   Neurologic:  Alert and  oriented x4;  grossly normal neurologically.  Impression/Plan: Ayad Nieman is here for an colonoscopy to be performed for Screening colonoscopy average risk   Risks, benefits, limitations, and alternatives regarding  colonoscopy have been reviewed with the patient.  Questions have been answered.  All parties agreeable.   Jonathon Bellows, MD  01/09/2020, 9:39 AM

## 2020-01-09 NOTE — Transfer of Care (Signed)
Immediate Anesthesia Transfer of Care Note  Patient: Jason Leonard  Procedure(s) Performed: COLONOSCOPY WITH PROPOFOL (N/A )  Patient Location: Endoscopy Unit  Anesthesia Type:General  Level of Consciousness: drowsy, patient cooperative and responds to stimulation  Airway & Oxygen Therapy: Patient Spontanous Breathing and Patient connected to face mask oxygen  Post-op Assessment: Report given to RN and Post -op Vital signs reviewed and stable  Post vital signs: Reviewed and stable  Last Vitals:  Vitals Value Taken Time  BP 126/89 01/09/20 1011  Temp 35.9 C 01/09/20 1011  Pulse 66 01/09/20 1012  Resp 22 01/09/20 1012  SpO2 100 % 01/09/20 1012  Vitals shown include unvalidated device data.  Last Pain:  Vitals:   01/09/20 1011  TempSrc: Temporal         Complications: No complications documented.

## 2020-01-09 NOTE — Anesthesia Procedure Notes (Signed)
Procedure Name: General with mask airway Performed by: Kelton Pillar, CRNA Pre-anesthesia Checklist: Patient identified, Emergency Drugs available, Patient being monitored and Suction available Patient Re-evaluated:Patient Re-evaluated prior to induction Oxygen Delivery Method: Simple face mask Induction Type: IV induction Placement Confirmation: positive ETCO2 and CO2 detector Dental Injury: Teeth and Oropharynx as per pre-operative assessment

## 2020-01-09 NOTE — Op Note (Signed)
[facility] Gastroenterology Patient Name: Jason Leonard Procedure Date: 01/09/2020 9:36 AM MRN: [id number] Account #: [id number] Date of Birth: 19-Jan-1971 Admit Type: Outpatient Age: 49 Room: [facility] ENDO ROOM 3 Gender: Male Note Status: Finalized Procedure:             Colonoscopy Indications:           Screening for colorectal malignant neoplasm Providers:             Jonathon Bellows MD, MD Medicines:             Monitored Anesthesia Care Complications:         No immediate complications. Procedure:             Pre-Anesthesia Assessment:                        - Prior to the procedure, a History and Physical was                         performed, and patient medications, allergies and                         sensitivities were reviewed. The patient's tolerance                         of previous anesthesia was reviewed.                        - The risks and benefits of the procedure and the                         sedation options and risks were discussed with the                         patient. All questions were answered and informed                         consent was obtained.                        - ASA Grade Assessment: II - A patient with mild                         systemic disease.                        After obtaining informed consent, the colonoscope was                         passed under direct vision. Throughout the procedure,                         the patient's blood pressure, pulse, and oxygen                         saturations were monitored continuously. The                         Colonoscope was introduced through the anus and  advanced to the the cecum, identified by the                         appendiceal orifice. The colonoscopy was performed                         with ease. The patient tolerated the procedure well.                         The quality of the bowel preparation was excellent. Findings:      The  entire examined colon appeared normal on direct and retroflexion       views. Impression:            - The entire examined colon is normal on direct and                         retroflexion views.                        - No specimens collected. Recommendation:        - Discharge patient to home (with escort).                        - Resume previous diet.                        - Continue present medications.                        - Repeat colonoscopy in 10 years for screening                         purposes. Procedure Code(s):     --- Professional ---                        [phone], Colonoscopy, flexible; diagnostic, including                         collection of specimen(s) by brushing or washing, when                         performed (separate procedure) Diagnosis Code(s):     --- Professional ---                        Z12.11, Encounter for screening for malignant neoplasm                         of colon CPT copyright 2019 American Medical Association. All rights reserved. The codes documented in this report are preliminary and upon coder review may  be revised to meet current compliance requirements. Jonathon Bellows, MD Jonathon Bellows MD, MD 01/09/2020 10:08:36 AM This report has been signed electronically. Number of Addenda: 0 Note Initiated On: 01/09/2020 9:36 AM Scope Withdrawal Time: 0 hours 15 minutes 24 seconds  Total Procedure Duration: 0 hours 19 minutes 24 seconds  Estimated Blood Loss:  Estimated blood loss: none.      [facility]

## 2020-01-09 NOTE — Anesthesia Preprocedure Evaluation (Signed)
Anesthesia Evaluation  Patient identified by MRN, date of birth, ID band Patient awake    Reviewed: Allergy & Precautions, NPO status , Patient's Chart, lab work & pertinent test results  History of Anesthesia Complications Negative for: history of anesthetic complications  Airway Mallampati: III  TM Distance: >3 FB Neck ROM: Full    Dental no notable dental hx. (+) Teeth Intact   Pulmonary neg pulmonary ROS, neg sleep apnea, neg COPD, Patient abstained from smoking.Not current smoker,    Pulmonary exam normal breath sounds clear to auscultation       Cardiovascular Exercise Tolerance: Good METShypertension, (-) CAD and (-) Past MI (-) dysrhythmias  Rhythm:Regular Rate:Normal - Systolic murmurs    Neuro/Psych negative neurological ROS  negative psych ROS   GI/Hepatic neg GERD  ,(+)     (-) substance abuse  ,   Endo/Other  neg diabetes  Renal/GU Renal InsufficiencyRenal disease     Musculoskeletal   Abdominal   Peds  Hematology   Anesthesia Other Findings Past Medical History: No date: Hyperlipidemia     Comment:  low HDL, high LDL - no meds, diet controlled No date: Hypertension  Reproductive/Obstetrics                             Anesthesia Physical Anesthesia Plan  ASA: II  Anesthesia Plan: General   Post-op Pain Management:    Induction: Intravenous  PONV Risk Score and Plan: 2 and Ondansetron, Propofol infusion and TIVA  Airway Management Planned: Nasal Cannula  Additional Equipment: None  Intra-op Plan:   Post-operative Plan:   Informed Consent: I have reviewed the patients History and Physical, chart, labs and discussed the procedure including the risks, benefits and alternatives for the proposed anesthesia with the patient or authorized representative who has indicated his/her understanding and acceptance.       Plan Discussed with: CRNA and  Surgeon  Anesthesia Plan Comments: (Discussed risks of anesthesia with patient, including possibility of difficulty with spontaneous ventilation under anesthesia necessitating airway intervention, PONV, and rare risks such as cardiac or respiratory or neurological events. Patient understands.)        Anesthesia Quick Evaluation

## 2020-01-10 ENCOUNTER — Encounter: Payer: Self-pay | Admitting: Gastroenterology

## 2020-01-10 NOTE — Anesthesia Postprocedure Evaluation (Signed)
Anesthesia Post Note  Patient: Jason Leonard  Procedure(s) Performed: COLONOSCOPY WITH PROPOFOL (N/A )  Patient location during evaluation: PACU Anesthesia Type: General Level of consciousness: awake and alert Pain management: pain level controlled Vital Signs Assessment: post-procedure vital signs reviewed and stable Respiratory status: spontaneous breathing, nonlabored ventilation and respiratory function stable Cardiovascular status: blood pressure returned to baseline and stable Postop Assessment: no apparent nausea or vomiting Anesthetic complications: no   No complications documented.   Last Vitals:  Vitals:   01/09/20 1041 01/09/20 1051  BP: 139/89 (!) 144/101  Pulse: (!) 50 (!) 53  Resp: 18 18  Temp:    SpO2: 100% 98%    Last Pain:  Vitals:   01/09/20 1011  TempSrc: Temporal                 Tera Mater

## 2020-02-12 ENCOUNTER — Encounter: Payer: Self-pay | Admitting: Family Medicine

## 2020-02-12 ENCOUNTER — Ambulatory Visit (INDEPENDENT_AMBULATORY_CARE_PROVIDER_SITE_OTHER): Payer: Managed Care, Other (non HMO) | Admitting: Family Medicine

## 2020-02-12 ENCOUNTER — Other Ambulatory Visit: Payer: Self-pay

## 2020-02-12 VITALS — BP 134/78 | HR 76 | Temp 97.6°F | Resp 18 | Ht 68.0 in | Wt 222.4 lb

## 2020-02-12 DIAGNOSIS — D696 Thrombocytopenia, unspecified: Secondary | ICD-10-CM | POA: Diagnosis not present

## 2020-02-12 DIAGNOSIS — E782 Mixed hyperlipidemia: Secondary | ICD-10-CM | POA: Diagnosis not present

## 2020-02-12 DIAGNOSIS — I10 Essential (primary) hypertension: Secondary | ICD-10-CM

## 2020-02-12 DIAGNOSIS — Z5181 Encounter for therapeutic drug level monitoring: Secondary | ICD-10-CM

## 2020-02-12 NOTE — Patient Instructions (Addendum)
Come by for labs to recheck your platelets and chemistery (kidney and liver function) in about 2-3 months - no appointment needed, come any weekday 8 am to 12 noon or 2 pm - 4 pm  How to Take Your Blood Pressure You can take your blood pressure at home with a machine. You may need to check your blood pressure at home:  To check if you have high blood pressure (hypertension).  To check your blood pressure over time.  To make sure your blood pressure medicine is working. Supplies needed: You will need a blood pressure machine, or monitor. You can buy one at a drugstore or online. When choosing one:  Choose one with an arm cuff.  Choose one that wraps around your upper arm. Only one finger should fit between your arm and the cuff.  Do not choose one that measures your blood pressure from your wrist or finger. Your doctor can suggest a monitor. How to prepare Avoid these things for 30 minutes before checking your blood pressure:  Drinking caffeine.  Drinking alcohol.  Eating.  Smoking.  Exercising. Five minutes before checking your blood pressure:  Pee.  Sit in a dining chair. Avoid sitting in a soft couch or armchair.  Be quiet. Do not talk. How to take your blood pressure Follow the instructions that came with your machine. If you have a digital blood pressure monitor, these may be the instructions: 1. Sit up straight. 2. Place your feet on the floor. Do not cross your ankles or legs. 3. Rest your left arm at the level of your heart. You may rest it on a table, desk, or chair. 4. Pull up your shirt sleeve. 5. Wrap the blood pressure cuff around the upper part of your left arm. The cuff should be 1 inch (2.5 cm) above your elbow. It is best to wrap the cuff around bare skin. 6. Fit the cuff snugly around your arm. You should be able to place only one finger between the cuff and your arm. 7. Put the cord inside the groove of your elbow. 8. Press the power button. 9. Sit  quietly while the cuff fills with air and loses air. 10. Write down the numbers on the screen. 11. Wait 2-3 minutes and then repeat steps 1-10. What do the numbers mean? Two numbers make up your blood pressure. The first number is called systolic pressure. The second is called diastolic pressure. An example of a blood pressure reading is "120 over 80" (or 120/80). If you are an adult and do not have a medical condition, use this guide to find out if your blood pressure is normal: Normal  First number: below 120.  Second number: below 80. Elevated  First number: 120-129.  Second number: below 80. Hypertension stage 1  First number: 130-139.  Second number: 80-89. Hypertension stage 2  First number: 140 or above.  Second number: 47 or above. Your blood pressure is above normal even if only the top or bottom number is above normal. Follow these instructions at home:  Check your blood pressure as often as your doctor tells you to.  Take your monitor to your next doctor's appointment. Your doctor will: ? Make sure you are using it correctly. ? Make sure it is working right.  Make sure you understand what your blood pressure numbers should be.  Tell your doctor if your medicines are causing side effects. Contact a doctor if:  Your blood pressure keeps being high. Get help  right away if:  Your first blood pressure number is higher than 180.  Your second blood pressure number is higher than 120. This information is not intended to replace advice given to you by your health care provider. Make sure you discuss any questions you have with your health care provider. Document Revised: 06/02/2017 Document Reviewed: 11/27/2015 Elsevier Patient Education  Ar[place] DASH stands for "Dietary Approaches to Stop Hypertension." The DASH eating plan is a healthy eating plan that has been shown to reduce high blood pressure (hypertension). It may also reduce  your risk for type 2 diabetes, heart disease, and stroke. The DASH eating plan may also help with weight loss. What are tips for following this plan?  General guidelines  Avoid eating more than 2,300 mg (milligrams) of salt (sodium) a day. If you have hypertension, you may need to reduce your sodium intake to 1,500 mg a day.  Limit alcohol intake to no more than 1 drink a day for nonpregnant women and 2 drinks a day for men. One drink equals 12 oz of beer, 5 oz of wine, or 1 oz of hard liquor.  Work with your health care provider to maintain a healthy body weight or to lose weight. Ask what an ideal weight is for you.  Get at least 30 minutes of exercise that causes your heart to beat faster (aerobic exercise) most days of the week. Activities may include walking, swimming, or biking.  Work with your health care provider or diet and nutrition specialist (dietitian) to adjust your eating plan to your individual calorie needs. Reading food labels   Check food labels for the amount of sodium per serving. Choose foods with less than 5 percent of the Daily Value of sodium. Generally, foods with less than 300 mg of sodium per serving fit into this eating plan.  To find whole grains, look for the word "whole" as the first word in the ingredient list. Shopping  Buy products labeled as "low-sodium" or "no salt added."  Buy fresh foods. Avoid canned foods and premade or frozen meals. Cooking  Avoid adding salt when cooking. Use salt-free seasonings or herbs instead of table salt or sea salt. Check with your health care provider or pharmacist before using salt substitutes.  Do not fry foods. Cook foods using healthy methods such as baking, boiling, grilling, and broiling instead.  Cook with heart-healthy oils, such as olive, canola, soybean, or sunflower oil. Meal planning  Eat a balanced diet that includes: ? 5 or more servings of fruits and vegetables each day. At each meal, try to fill  half of your plate with fruits and vegetables. ? Up to 6-8 servings of whole grains each day. ? Less than 6 oz of lean meat, poultry, or fish each day. A 3-oz serving of meat is about the same size as a deck of cards. One egg equals 1 oz. ? 2 servings of low-fat dairy each day. ? A serving of nuts, seeds, or beans 5 times each week. ? Heart-healthy fats. Healthy fats called Omega-3 fatty acids are found in foods such as flaxseeds and coldwater fish, like sardines, salmon, and mackerel.  Limit how much you eat of the following: ? Canned or prepackaged foods. ? Food that is high in trans fat, such as fried foods. ? Food that is high in saturated fat, such as fatty meat. ? Sweets, desserts, sugary drinks, and other foods with added sugar. ? Full-fat dairy products.  Do  not salt foods before eating.  Try to eat at least 2 vegetarian meals each week.  Eat more home-cooked food and less restaurant, buffet, and fast food.  When eating at a restaurant, ask that your food be prepared with less salt or no salt, if possible. What foods are recommended? The items listed may not be a complete list. Talk with your dietitian about what dietary choices are best for you. Grains Whole-grain or whole-wheat bread. Whole-grain or whole-wheat pasta. Brown rice. [name]. Bulgur. Whole-grain and low-sodium cereals. Pita bread. Low-fat, low-sodium crackers. Whole-wheat flour tortillas. Vegetables Fresh or frozen vegetables (raw, steamed, roasted, or grilled). Low-sodium or reduced-sodium tomato and vegetable juice. Low-sodium or reduced-sodium tomato sauce and tomato paste. Low-sodium or reduced-sodium canned vegetables. Fruits All fresh, dried, or frozen fruit. Canned fruit in natural juice (without added sugar). Meat and other protein foods Skinless chicken or [place]. Ground chicken or [place]. Pork with fat trimmed off. Fish and seafood. Egg whites. Dried beans, peas, or lentils. Unsalted nuts, nut  butters, and seeds. Unsalted canned beans. Lean cuts of beef with fat trimmed off. Low-sodium, lean deli meat. Dairy Low-fat (1%) or fat-free (skim) milk. Fat-free, low-fat, or reduced-fat cheeses. Nonfat, low-sodium ricotta or cottage cheese. Low-fat or nonfat yogurt. Low-fat, low-sodium cheese. Fats and oils Soft margarine without trans fats. Vegetable oil. Low-fat, reduced-fat, or light mayonnaise and salad dressings (reduced-sodium). Canola, safflower, olive, soybean, and sunflower oils. Avocado. Seasoning and other foods Herbs. Spices. Seasoning mixes without salt. Unsalted popcorn and pretzels. Fat-free sweets. What foods are not recommended? The items listed may not be a complete list. Talk with your dietitian about what dietary choices are best for you. Grains Baked goods made with fat, such as croissants, muffins, or some breads. Dry pasta or rice meal packs. Vegetables Creamed or fried vegetables. Vegetables in a cheese sauce. Regular canned vegetables (not low-sodium or reduced-sodium). Regular canned tomato sauce and paste (not low-sodium or reduced-sodium). Regular tomato and vegetable juice (not low-sodium or reduced-sodium). Angie Fava. Olives. Fruits Canned fruit in a light or heavy syrup. Fried fruit. Fruit in cream or butter sauce. Meat and other protein foods Fatty cuts of meat. Ribs. Fried meat. Berniece Salines. Sausage. Bologna and other processed lunch meats. Salami. Fatback. Hotdogs. Bratwurst. Salted nuts and seeds. Canned beans with added salt. Canned or smoked fish. Whole eggs or egg yolks. Chicken or [place] with skin. Dairy Whole or 2% milk, cream, and half-and-half. Whole or full-fat cream cheese. Whole-fat or sweetened yogurt. Full-fat cheese. Nondairy creamers. Whipped toppings. Processed cheese and cheese spreads. Fats and oils Butter. Stick margarine. Lard. Shortening. Ghee. Bacon fat. Tropical oils, such as coconut, palm kernel, or palm oil. Seasoning and other foods Salted  popcorn and pretzels. Onion salt, garlic salt, seasoned salt, table salt, and sea salt. Worcestershire sauce. Tartar sauce. Barbecue sauce. Teriyaki sauce. Soy sauce, including reduced-sodium. Steak sauce. Canned and packaged gravies. Fish sauce. Oyster sauce. Cocktail sauce. Horseradish that you find on the shelf. Ketchup. Mustard. Meat flavorings and tenderizers. Bouillon cubes. Hot sauce and Tabasco sauce. Premade or packaged marinades. Premade or packaged taco seasonings. Relishes. Regular salad dressings. Where to find more information:  National Heart, Lung, and [place]: https://wilson-eaton.com/  American Heart Association: www.heart.org Summary  The DASH eating plan is a healthy eating plan that has been shown to reduce high blood pressure (hypertension). It may also reduce your risk for type 2 diabetes, heart disease, and stroke.  With the DASH eating plan, you should limit salt (sodium) intake  to 2,300 mg a day. If you have hypertension, you may need to reduce your sodium intake to 1,500 mg a day.  When on the DASH eating plan, aim to eat more fresh fruits and vegetables, whole grains, lean proteins, low-fat dairy, and heart-healthy fats.  Work with your health care provider or diet and nutrition specialist (dietitian) to adjust your eating plan to your individual calorie needs. This information is not intended to replace advice given to you by your health care provider. Make sure you discuss any questions you have with your health care provider. Document Revised: 06/02/2017 Document Reviewed: 06/13/2016 Elsevier Patient Education  2020 [employer].

## 2020-02-12 NOTE — Progress Notes (Signed)
Name: Jason Leonard   MRN: [id number]    DOB: December 06, 1970   Date:02/12/2020       Progress Note  Chief Complaint  Patient presents with  . Hypertension    follow up  . Hyperlipidemia     Subjective:   Jason Leonard is a 49 y.o. male, presents to clinic for routine f/up   Hypertension:  Currently managed on lisinopril 10 mg Pt reports good med compliance and denies any SE.  No lightheadedness, hypotension, syncope. Blood pressure today is well controlled.   He is occasionally monitoring and systolic blood pressure tends to run 120-130, improved from prior he is continuing to work on diet lifestyle and exercise, low-salt diet BP Readings from Last 10 Encounters:  02/12/20 134/78  01/09/20 (!) 144/101  12/10/19 132/86  08/15/19 124/70  07/03/18 (!) 150/98  06/12/18 (!) 150/100  09/28/11 160/70   Pt denies CP, SOB, exertional sx, LE edema, palpitation, Ha's, visual disturbances  Hyperlipidemia: Currently treated with simvastatin 20 mg, pt reports good med compliance  -he will occasionally miss 1 or 2 doses a month Last Lipids: Lab Results  Component Value Date   CHOL 181 12/10/2019   HDL 68 12/10/2019   LDLCALC 100 (H) 12/10/2019   TRIG 52 12/10/2019   CHOLHDL 2.7 12/10/2019   - Denies: Chest pain, shortness of breath, myalgias, claudication Lipids recently checked at his physical his LDL increased slightly from previously and he is working on diet lifestyle   Current Outpatient Medications:  .  lisinopril (ZESTRIL) 20 MG tablet, Take 1 tablet (20 mg total) by mouth daily., Disp: 90 tablet, Rfl: 3 .  simvastatin (ZOCOR) 20 MG tablet, Take 1 tablet (20 mg total) by mouth at bedtime., Disp: 90 tablet, Rfl: 3  Patient Active Problem List   Diagnosis Date Noted  . Hypertension 06/14/2018  . Hyperlipidemia 06/14/2018    Past Surgical History:  Procedure Laterality Date  . ARTHROSCOPIC REPAIR ACL    . COLONOSCOPY WITH PROPOFOL N/A 01/09/2020   Procedure: COLONOSCOPY  WITH PROPOFOL;  Surgeon: Jonathon Bellows, MD;  Location: [facility] ENDOSCOPY;  Service: Gastroenterology;  Laterality: N/A;  . WISDOM TOOTH EXTRACTION      Family History  Problem Relation Age of Onset  . Hypertension Mother   . Hypertension Father   . Lupus Sister   . Diabetes Maternal Grandfather   . Cancer Maternal Grandfather   . Cancer Paternal Grandmother     Social History   Tobacco Use  . Smoking status: Never Smoker  . Smokeless tobacco: Never Used  Vaping Use  . Vaping Use: Never used  Substance Use Topics  . Alcohol use: No  . Drug use: No     No Known Allergies  Health Maintenance  Topic Date Due  . COVID-19 Vaccine (2 - Pfizer 2-dose series) 12/06/2019  . INFLUENZA VACCINE  02/02/2020  . TETANUS/TDAP  08/14/2020 (Originally 09/20/1989)  . HIV Screening  08/14/2020 (Originally 09/20/1985)    Chart Review Today: I personally reviewed active problem list, medication list, allergies, family history, social history, health maintenance, notes from last encounter, lab results, imaging with the patient/caregiver today.   Review of Systems  10 Systems reviewed and are negative for acute change except as noted in the HPI.  Objective:   Vitals:   02/12/20 0911  BP: 134/78  Pulse: 76  Resp: 18  Temp: 97.6 F (36.4 C)  TempSrc: Oral  SpO2: 97%  Weight: 222 lb 6.4 oz (100.9 kg)  Height: 5'  8" (1.727 m)    Body mass index is 33.82 kg/m.  Physical Exam Vitals and nursing note reviewed.  Constitutional:      General: He is not in acute distress.    Appearance: Normal appearance. He is well-developed. He is not ill-appearing, toxic-appearing or diaphoretic.     Interventions: Face mask in place.     Comments: Muscular male, appears stated age  HENT:     Head: Normocephalic and atraumatic.     Jaw: No trismus.     Right Ear: External ear normal.     Left Ear: External ear normal.  Eyes:     General: Lids are normal. No scleral icterus.    Conjunctiva/sclera:  Conjunctivae normal.     Pupils: Pupils are equal, round, and reactive to light.  Neck:     Trachea: Trachea and phonation normal. No tracheal deviation.  Cardiovascular:     Rate and Rhythm: Normal rate and regular rhythm.     Pulses: Normal pulses.          Radial pulses are 2+ on the right side and 2+ on the left side.       Posterior tibial pulses are 2+ on the right side and 2+ on the left side.     Heart sounds: Normal heart sounds. No murmur heard.  No friction rub. No gallop.   Pulmonary:     Effort: Pulmonary effort is normal. No respiratory distress.     Breath sounds: Normal breath sounds. No stridor. No wheezing, rhonchi or rales.  Abdominal:     Tenderness: There is no abdominal tenderness. There is no rebound.  Musculoskeletal:     Cervical back: Normal range of motion and neck supple.     Right lower leg: No edema.     Left lower leg: No edema.  Skin:    General: Skin is warm and dry.     Coloration: Skin is not jaundiced or pale.     Findings: No rash.     Nails: There is no clubbing.  Neurological:     Mental Status: He is alert. Mental status is at baseline.     Cranial Nerves: No dysarthria or facial asymmetry.     Motor: No tremor or abnormal muscle tone.     Gait: Gait normal.  Psychiatric:        Mood and Affect: Mood normal.        Speech: Speech normal.        Behavior: Behavior normal. Behavior is cooperative.         Assessment & Plan:     ICD-10-CM   1. Hypertension, unspecified type  I10 Comprehensive metabolic panel   Stable and well-controlled with lisinopril 10 mg daily, continue meds diet lifestyle efforts  2. Mixed hyperlipidemia  E78.2 Comprehensive metabolic panel   LDL is up just slightly compared to labs last year he is compliant with meds and working on diet and exercise, will repeat labs annually next June  3. Thrombocytopenia (HCC)  D69.6 CBC with Differential/Platelet   Mild thrombocytopenia, may be secondary to Covid  vaccination?  Slight decline with past 2 labs, repeat labs in 2 to 3 months, refer to hematology if worsening  4. Encounter for medication monitoring  Z51.81 CBC with Differential/Platelet    Comprehensive metabolic panel     Return in about 10 months (around 12/14/2020) for CPE and HTN/HLD f/up after 12/09/2020 - sooner if sx or high BP.   Delsa Grana, PA-C  02/12/20 9:29 AM

## 2020-05-19 ENCOUNTER — Encounter: Payer: Self-pay | Admitting: Family Medicine

## 2020-08-06 ENCOUNTER — Encounter: Payer: Self-pay | Admitting: Family Medicine

## 2020-08-18 ENCOUNTER — Ambulatory Visit: Payer: Managed Care, Other (non HMO) | Admitting: Family Medicine

## 2020-08-19 ENCOUNTER — Other Ambulatory Visit: Payer: Self-pay | Admitting: Family Medicine

## 2020-08-19 ENCOUNTER — Ambulatory Visit (INDEPENDENT_AMBULATORY_CARE_PROVIDER_SITE_OTHER): Payer: Managed Care, Other (non HMO) | Admitting: Family Medicine

## 2020-08-19 ENCOUNTER — Encounter: Payer: Self-pay | Admitting: Family Medicine

## 2020-08-19 ENCOUNTER — Other Ambulatory Visit: Payer: Self-pay

## 2020-08-19 ENCOUNTER — Other Ambulatory Visit (HOSPITAL_COMMUNITY)
Admission: RE | Admit: 2020-08-19 | Discharge: 2020-08-19 | Disposition: A | Discharge status: Home / Self Care | Payer: Managed Care, Other (non HMO) | Source: Ambulatory Visit | Attending: Family Medicine | Admitting: Family Medicine

## 2020-08-19 VITALS — BP 164/100 | HR 82 | Temp 98.5°F | Resp 16 | Ht 68.0 in | Wt 226.8 lb

## 2020-08-19 DIAGNOSIS — R361 Hematospermia: Secondary | ICD-10-CM

## 2020-08-19 DIAGNOSIS — R369 Urethral discharge, unspecified: Secondary | ICD-10-CM

## 2020-08-19 DIAGNOSIS — I10 Essential (primary) hypertension: Secondary | ICD-10-CM

## 2020-08-19 NOTE — Progress Notes (Signed)
    SUBJECTIVE:   CHIEF COMPLAINT / HPI:   Patient Active Problem List   Diagnosis Date Noted  . Blood in semen 08/19/2020  . Hypertension 06/14/2018  . Hyperlipidemia 06/14/2018   PENILE COMPLAINT - noted blood in semen after sex about twice in last few months with associated b/l groin pain, feels like getting hit in testicles.  - no other pain outside of sex, lasts few seconds, self-resolves. - urinating ok, no dysuria, hematuria - no penile discharge - no lesions or rashes, or wounds - no fevers - no testicular swelling or redness. Does not do self-testicular exams. - remote h/o chlamydia in college, s/p treatment. No other h/o STIs. - sexually active with one partner, male. - no painful erections - no prior abdominal or genital surgeries.   OBJECTIVE:   BP (!) 164/100   Pulse 82   Temp 98.5 F (36.9 C) (Oral)   Resp 16   Ht '5\' 8"'$  (1.727 m)   Wt 226 lb 12.8 oz (102.9 kg)   SpO2 99%   BMI 34.48 kg/m   Gen: well appearing in NAD Genital: uncircumcised. No rashes, lesions, ulcers visible. B/l testicles without mass, swelling, erythema. No inguinal hernia appreciated.   ASSESSMENT/PLAN:   Blood in semen Unclear etiology. Normal exam today.  No current findings to concern for malignancy. History and exam not consistent with obstructive process, may have postcoital trauma contributing. Will obtain urine studies to assess for infection. If normal, consider urology referral for further evaluation.  Hypertension Elevated today but with prior good control. Will have patient keep at home log and return in 1 month for recheck.      Myles Gip, DO

## 2020-08-19 NOTE — Patient Instructions (Signed)
It was great to see you!  Our plans for today:  - We are checking some urine studies today, we will release these results to your MyChart. If these are normal, we will refer you to Urology. - Come back in 1 month to follow up on your blood pressure. Keep a log of your blood pressures at home and bring these with you to your next visit.  Take care and seek immediate care sooner if you develop any concerns.   Dr. Ky Barban

## 2020-08-19 NOTE — Assessment & Plan Note (Addendum)
Unclear etiology. Normal exam today.  No current findings to concern for malignancy. History and exam not consistent with obstructive process, may have postcoital trauma contributing. Will obtain urine studies to assess for infection. If normal, consider urology referral for further evaluation.

## 2020-08-19 NOTE — Assessment & Plan Note (Signed)
Elevated today but with prior good control. Will have patient keep at home log and return in 1 month for recheck.

## 2020-08-20 LAB — URINALYSIS, ROUTINE W REFLEX MICROSCOPIC
Bacteria, UA: NONE SEEN /HPF
Bilirubin Urine: NEGATIVE
Glucose, UA: NEGATIVE
Hgb urine dipstick: NEGATIVE
Hyaline Cast: NONE SEEN /LPF
Ketones, ur: NEGATIVE
Leukocytes,Ua: NEGATIVE
Nitrite: NEGATIVE
RBC / HPF: NONE SEEN /HPF (ref 0–2)
Specific Gravity, Urine: 1.024 (ref 1.001–1.03)
Squamous Epithelial / HPF: NONE SEEN /HPF
WBC, UA: NONE SEEN /HPF (ref 0–5)
pH: 5.5 (ref 5.0–8.0)

## 2020-08-20 LAB — URINE CYTOLOGY ANCILLARY ONLY
Chlamydia: NEGATIVE
Comment: NEGATIVE
Comment: NEGATIVE
Comment: NORMAL
Neisseria Gonorrhea: NEGATIVE
Trichomonas: NEGATIVE

## 2020-08-20 NOTE — Addendum Note (Signed)
Addended by: Myles Gip on: 08/20/2020 03:16 PM   Modules accepted: Orders

## 2020-08-31 ENCOUNTER — Encounter: Payer: Self-pay | Admitting: Urology

## 2020-08-31 ENCOUNTER — Ambulatory Visit (INDEPENDENT_AMBULATORY_CARE_PROVIDER_SITE_OTHER): Payer: Managed Care, Other (non HMO) | Admitting: Urology

## 2020-08-31 ENCOUNTER — Other Ambulatory Visit: Payer: Self-pay

## 2020-08-31 VITALS — BP 171/104 | HR 80 | Ht 68.0 in | Wt 225.1 lb

## 2020-08-31 DIAGNOSIS — R361 Hematospermia: Secondary | ICD-10-CM

## 2020-08-31 NOTE — Patient Instructions (Signed)
Prostate Cancer Screening  Prostate cancer screening is a test that is done to check for the presence of prostate cancer in men. The prostate gland is a walnut-sized gland that is located below the bladder and in front of the rectum in males. The function of the prostate is to add fluid to semen during ejaculation. Prostate cancer is the second most common type of cancer in men. Who should have prostate cancer screening?  Screening recommendations vary based on age and other risk factors. Screening is recommended if:  You are older than age 55. If you are age 55-69, talk with your health care provider about your need for screening and how often screening should be done. Because most prostate cancers are slow growing and will not cause death, screening is generally reserved in this age group for men who have a 10-15-year life expectancy.  You are younger than age 55, and you have these risk factors: ? Being a black male or a male of African descent. ? Having a father, brother, or uncle who has been diagnosed with prostate cancer. The risk is higher if your family member's cancer occurred at an early age. Screening is not recommended if:  You are younger than age 40.  You are between the ages of 40 and 54 and you have no risk factors.  You are 70 years of age or older. At this age, the risks that screening can cause are greater than the benefits that it may provide. If you are at high risk for prostate cancer, your health care provider may recommend that you have screenings more often or that you start screening at a younger age. How is screening for prostate cancer done? The recommended prostate cancer screening test is a blood test called the prostate-specific antigen (PSA) test. PSA is a protein that is made in the prostate. As you age, your prostate naturally produces more PSA. Abnormally high PSA levels may be caused by:  Prostate cancer.  An enlarged prostate that is not caused by cancer  (benign prostatic hyperplasia, BPH). This condition is very common in older men.  A prostate gland infection (prostatitis). Depending on the PSA results, you may need more tests, such as:  A physical exam to check the size of your prostate gland.  Blood and imaging tests.  A procedure to remove tissue samples from your prostate gland for testing (biopsy). What are the benefits of prostate cancer screening?  Screening can help to identify cancer at an early stage, before symptoms start and when the cancer can be treated more easily.  There is a small chance that screening may lower your risk of dying from prostate cancer. The chance is small because prostate cancer is a slow-growing cancer, and most men with prostate cancer die from a different cause. What are the risks of prostate cancer screening? The main risk of prostate cancer screening is diagnosing and treating prostate cancer that would never have caused any symptoms or problems. This is called overdiagnosisand overtreatment. PSA screening cannot tell you if your PSA is high due to cancer or a different cause. A prostate biopsy is the only procedure to diagnose prostate cancer. Even the results of a biopsy may not tell you if your cancer needs to be treated. Slow-growing prostate cancer may not need any treatment other than monitoring, so diagnosing and treating it may cause unnecessary stress or other side effects. A prostate biopsy may also cause:  Infection or fever.  A false negative. This is   a result that shows that you do not have prostate cancer when you actually do have prostate cancer. Questions to ask your health care provider  When should I start prostate cancer screening?  What is my risk for prostate cancer?  How often do I need screening?  What type of screening tests do I need?  How do I get my test results?  What do my results mean?  Do I need treatment? Where to find more information  The American Cancer  Society: www.cancer.org  American Urological Association: www.auanet.org Contact a health care provider if:  You have difficulty urinating.  You have pain when you urinate or ejaculate.  You have blood in your urine or semen.  You have pain in your back or in the area of your prostate. Summary  Prostate cancer is a common type of cancer in men. The prostate gland is located below the bladder and in front of the rectum. This gland adds fluid to semen during ejaculation.  Prostate cancer screening may identify cancer at an early stage, when the cancer can be treated more easily.  The prostate-specific antigen (PSA) test is the recommended screening test for prostate cancer.  Discuss the risks and benefits of prostate cancer screening with your health care provider. If you are age 42 or older, the risks that screening can cause are greater than the benefits that it may provide. This information is not intended to replace advice given to you by your health care provider. Make sure you discuss any questions you have with your health care provider. Document Revised: 10/11/2019 Document Reviewed: 01/31/2019 Elsevier Patient Education  [place].

## 2020-08-31 NOTE — Progress Notes (Signed)
   08/31/20 11:35 AM   Jason Leonard Feb 03, 1971 [id number]  CC: hematospermia  HPI: I saw Jason Leonard in urology clinic today for hematospermia.  He is a healthy 50 year old male with no family history of prostate cancer he reports 2 episodes of hematospermia that were associated with some scrotal pain in November 2021.  He has had no further episodes, and denies any urinary symptoms or gross hematuria.  He has not had any further scrotal pain.  Urinalysis was checked with PCP recently on 08/19/2020 was completely benign.  STD testing was also negative.  No prior PSA values to review.   PMH: Past Medical History:  Diagnosis Date  . Hyperlipidemia    low HDL, high LDL - no meds, diet controlled  . Hypertension     Surgical History: Past Surgical History:  Procedure Laterality Date  . ARTHROSCOPIC REPAIR ACL    . COLONOSCOPY WITH PROPOFOL N/A 01/09/2020   Procedure: COLONOSCOPY WITH PROPOFOL;  Surgeon: Jonathon Bellows, MD;  Location: [facility] ENDOSCOPY;  Service: Gastroenterology;  Laterality: N/A;  . WISDOM TOOTH EXTRACTION      Family History: Family History  Problem Relation Age of Onset  . Hypertension Mother   . Hypertension Father   . Lupus Sister   . Diabetes Maternal Grandfather   . Cancer Maternal Grandfather   . Cancer Paternal Grandmother     Social History:  reports that he has never smoked. He has never used smokeless tobacco. He reports that he does not drink alcohol and does not use drugs.  Physical Exam: BP (!) 171/104 (BP Location: Left Arm, Patient Position: Sitting, Cuff Size: Large)   Pulse 80   Ht '5\' 8"'$  (1.727 m)   Wt 225 lb 1.6 oz (102.1 kg)   BMI 34.23 kg/m    Constitutional:  Alert and oriented, No acute distress. Cardiovascular: No clubbing, cyanosis, or edema. Respiratory: Normal respiratory effort, no increased work of breathing. GI: Abdomen is soft, nontender, nondistended, no abdominal masses GU: Circumcised phallus with patent meatus, no lesions,  patent meatus, testicles 20 cc and descended bilaterally without masses and nontender DRE: 30 g, smooth, no nodules or masses  Laboratory Data: Reviewed, see HPI  Pertinent Imaging: None to review  Assessment & Plan:   50 year old male with 2 episodes of hematospermia in November 2021 that self resolved.  He currently denies any symptoms including urinary symptoms, pelvic pain, scrotal pain, or gross hematuria.  We discussed that hematospermia is typically benign and self-limiting, but I recommended checking a PSA today and will call with those results.  Return precautions discussed.  PSA today, call with results.  If normal can follow-up as needed  Nickolas Madrid, MD 08/31/2020  [place] [address] [place], [place] [address] [phone]

## 2020-09-01 ENCOUNTER — Ambulatory Visit: Payer: Managed Care, Other (non HMO) | Admitting: Family Medicine

## 2020-09-01 LAB — PSA TOTAL (REFLEX TO FREE): Prostate Specific Ag, Serum: 2.5 ng/mL (ref 0.0–4.0)

## 2020-09-02 ENCOUNTER — Telehealth: Payer: Self-pay

## 2020-09-02 DIAGNOSIS — R972 Elevated prostate specific antigen [PSA]: Secondary | ICD-10-CM

## 2020-09-02 NOTE — Telephone Encounter (Signed)
-----   Message from Billey Co, MD sent at 09/01/2020 12:46 PM EST ----- PSA normal, can RTC 1 year with PSA prior, thanks  Nickolas Madrid, MD 09/01/2020

## 2020-09-02 NOTE — Telephone Encounter (Signed)
Called pt, no answer. Left detailed message for pt per DPR, PSA ordered. Appt scheduled.

## 2020-09-12 ENCOUNTER — Other Ambulatory Visit: Payer: Self-pay | Admitting: Family Medicine

## 2020-09-12 DIAGNOSIS — I10 Essential (primary) hypertension: Secondary | ICD-10-CM

## 2020-09-12 DIAGNOSIS — E782 Mixed hyperlipidemia: Secondary | ICD-10-CM

## 2020-09-15 ENCOUNTER — Ambulatory Visit (INDEPENDENT_AMBULATORY_CARE_PROVIDER_SITE_OTHER): Payer: Managed Care, Other (non HMO) | Admitting: Family Medicine

## 2020-09-15 ENCOUNTER — Encounter: Payer: Self-pay | Admitting: Family Medicine

## 2020-09-15 ENCOUNTER — Other Ambulatory Visit: Payer: Self-pay

## 2020-09-15 DIAGNOSIS — I10 Essential (primary) hypertension: Secondary | ICD-10-CM

## 2020-09-15 LAB — BASIC METABOLIC PANEL
BUN/Creatinine Ratio: 11 (calc) (ref 6–22)
BUN: 17 mg/dL (ref 7–25)
CO2: 32 mmol/L (ref 20–32)
Calcium: 9.4 mg/dL (ref 8.6–10.3)
Chloride: 104 mmol/L (ref 98–110)
Creat: 1.49 mg/dL — ABNORMAL HIGH (ref 0.60–1.35)
Glucose, Bld: 94 mg/dL (ref 65–99)
Potassium: 4.2 mmol/L (ref 3.5–5.3)
Sodium: 142 mmol/L (ref 135–146)

## 2020-09-15 MED ORDER — LISINOPRIL 30 MG PO TABS: 30.0000 mg | ORAL_TABLET | Freq: Every day | ORAL | 0 refills | Status: DC

## 2020-09-15 NOTE — Assessment & Plan Note (Signed)
Still uncontrolled. Will increase lisinopril and obtain BMP. Keep an eye on at home BP, if [id number] or less systolic, f/u in 3 months, if remains high, f/u in 1 month.

## 2020-09-15 NOTE — Patient Instructions (Signed)
It was great to see you!  Our plans for today:  - We are increasing your lisinopril dose, I sent a new prescription to the pharmacy.  - Keep an eye on your blood pressure. If your top number is consistently 140 or below, we will plan to see you back in 3 months. If it remains above 140, we will plan to see you back in 3 months.   We are checking some labs today, we will release these results to your MyChart.  Take care and seek immediate care sooner if you develop any concerns.   Dr. Ky Barban

## 2020-09-15 NOTE — Progress Notes (Signed)
    SUBJECTIVE:   CHIEF COMPLAINT / HPI:   Hypertension: - Medications: lisinopril '20mg'$  - Compliance: good - Checking BP at home: yes, 140-150s. - Denies any SOB, CP, vision changes, LE edema, medication SEs, or symptoms of hypotension   OBJECTIVE:   BP (!) 152/120   Pulse 73   Temp (!) 97.4 F (36.3 C) (Oral)   Resp 16   Ht '5\' 8"'$  (1.727 m)   Wt 225 lb 6.4 oz (102.2 kg)   SpO2 99%   BMI 34.27 kg/m   Gen: well appearing, in NAD Card: Reg rate Lungs: Comfortable WOB on RA Ext: WWP   ASSESSMENT/PLAN:   Hypertension Still uncontrolled. Will increase lisinopril and obtain BMP. Keep an eye on at home BP, if [id number] or less systolic, f/u in 3 months, if remains high, f/u in 1 month.    Myles Gip, DO

## 2020-10-28 ENCOUNTER — Other Ambulatory Visit: Payer: Self-pay | Admitting: Family Medicine

## 2020-10-28 DIAGNOSIS — D696 Thrombocytopenia, unspecified: Secondary | ICD-10-CM

## 2020-10-28 DIAGNOSIS — N184 Chronic kidney disease, stage 4 (severe): Secondary | ICD-10-CM

## 2020-10-28 DIAGNOSIS — Z5181 Encounter for therapeutic drug level monitoring: Secondary | ICD-10-CM

## 2020-10-28 DIAGNOSIS — E782 Mixed hyperlipidemia: Secondary | ICD-10-CM

## 2020-10-28 DIAGNOSIS — I10 Essential (primary) hypertension: Secondary | ICD-10-CM

## 2020-10-28 DIAGNOSIS — I129 Hypertensive chronic kidney disease with stage 1 through stage 4 chronic kidney disease, or unspecified chronic kidney disease: Secondary | ICD-10-CM

## 2020-10-29 MED ORDER — LISINOPRIL 30 MG PO TABS: 30.0000 mg | ORAL_TABLET | Freq: Every day | ORAL | 0 refills | Status: DC

## 2020-12-11 ENCOUNTER — Encounter: Payer: Managed Care, Other (non HMO) | Admitting: Family Medicine

## 2020-12-17 ENCOUNTER — Other Ambulatory Visit: Payer: Self-pay

## 2020-12-17 ENCOUNTER — Ambulatory Visit (INDEPENDENT_AMBULATORY_CARE_PROVIDER_SITE_OTHER): Payer: Managed Care, Other (non HMO) | Admitting: Unknown Physician Specialty

## 2020-12-17 ENCOUNTER — Encounter: Payer: Self-pay | Admitting: Unknown Physician Specialty

## 2020-12-17 VITALS — BP 132/84 | HR 80 | Temp 98.2°F | Resp 16 | Ht 68.0 in | Wt 225.0 lb

## 2020-12-17 DIAGNOSIS — Z114 Encounter for screening for human immunodeficiency virus [HIV]: Secondary | ICD-10-CM

## 2020-12-17 DIAGNOSIS — I10 Essential (primary) hypertension: Secondary | ICD-10-CM | POA: Diagnosis not present

## 2020-12-17 DIAGNOSIS — E669 Obesity, unspecified: Secondary | ICD-10-CM

## 2020-12-17 DIAGNOSIS — E782 Mixed hyperlipidemia: Secondary | ICD-10-CM | POA: Diagnosis not present

## 2020-12-17 DIAGNOSIS — Z5181 Encounter for therapeutic drug level monitoring: Secondary | ICD-10-CM | POA: Diagnosis not present

## 2020-12-17 DIAGNOSIS — Z0000 Encounter for general adult medical examination without abnormal findings: Secondary | ICD-10-CM

## 2020-12-17 DIAGNOSIS — Z125 Encounter for screening for malignant neoplasm of prostate: Secondary | ICD-10-CM

## 2020-12-17 MED ORDER — SIMVASTATIN 20 MG PO TABS: 20.0000 mg | ORAL_TABLET | Freq: Every day | ORAL | 1 refills | Status: DC

## 2020-12-17 MED ORDER — LISINOPRIL 30 MG PO TABS: 30.0000 mg | ORAL_TABLET | Freq: Every day | ORAL | 1 refills | Status: DC

## 2020-12-17 NOTE — Assessment & Plan Note (Signed)
Check lipid panel today 

## 2020-12-17 NOTE — Assessment & Plan Note (Signed)
Working on diet.

## 2020-12-17 NOTE — Assessment & Plan Note (Signed)
Stable, continue present medications.   

## 2020-12-17 NOTE — Patient Instructions (Signed)
Preventive Care 40-50 Years Old, Male Preventive care refers to lifestyle choices and visits with your health care provider that can promote health and wellness. This includes: A yearly physical exam. This is also called an annual wellness visit. Regular dental and eye exams. Immunizations. Screening for certain conditions. Healthy lifestyle choices, such as: Eating a healthy diet. Getting regular exercise. Not using drugs or products that contain nicotine and tobacco. Limiting alcohol use. What can I expect for my preventive care visit? Physical exam Your health care provider will check your: Height and weight. These may be used to calculate your BMI (body mass index). BMI is a measurement that tells if you are at a healthy weight. Heart rate and blood pressure. Body temperature. Skin for abnormal spots. Counseling Your health care provider may ask you questions about your: Past medical problems. Family's medical history. Alcohol, tobacco, and drug use. Emotional well-being. Home life and relationship well-being. Sexual activity. Diet, exercise, and sleep habits. Work and work environment. Access to firearms. What immunizations do I need?  Vaccines are usually given at various ages, according to a schedule. Your health care provider will recommend vaccines for you based on your age, medicalhistory, and lifestyle or other factors, such as travel or where you work. What tests do I need? Blood tests Lipid and cholesterol levels. These may be checked every 5 years, or more often if you are over 50 years old. Hepatitis C test. Hepatitis B test. Screening Lung cancer screening. You may have this screening every year starting at age 55 if you have a 30-pack-year history of smoking and currently smoke or have quit within the past 15 years. Prostate cancer screening. Recommendations will vary depending on your family history and other risks. Genital exam to check for testicular cancer  or hernias. Colorectal cancer screening. All adults should have this screening starting at age 50 and continuing until age 75. Your health care provider may recommend screening at age 45 if you are at increased risk. You will have tests every 1-10 years, depending on your results and the type of screening test. Diabetes screening. This is done by checking your blood sugar (glucose) after you have not eaten for a while (fasting). You may have this done every 1-3 years. STD (sexually transmitted disease) testing, if you are at risk. Follow these instructions at home: Eating and drinking  Eat a diet that includes fresh fruits and vegetables, whole grains, lean protein, and low-fat dairy products. Take vitamin and mineral supplements as recommended by your health care provider. Do not drink alcohol if your health care provider tells you not to drink. If you drink alcohol: Limit how much you have to 0-2 drinks a day. Be aware of how much alcohol is in your drink. In the [place] one drink equals one 12 oz bottle of beer (355 mL), one 5 oz glass of wine (148 mL), or one 1 oz glass of hard liquor (44 mL).  Lifestyle Take daily care of your teeth and gums. Brush your teeth every morning and night with fluoride toothpaste. Floss one time each day. Stay active. Exercise for at least 30 minutes 5 or more days each week. Do not use any products that contain nicotine or tobacco, such as cigarettes, e-cigarettes, and chewing tobacco. If you need help quitting, ask your health care provider. Do not use drugs. If you are sexually active, practice safe sex. Use a condom or other form of protection to prevent STIs (sexually transmitted infections). If told by   your health care provider, take low-dose aspirin daily starting at age 50. Find healthy ways to cope with stress, such as: Meditation, yoga, or listening to music. Journaling. Talking to a trusted person. Spending time with friends and  family. Safety Always wear your seat belt while driving or riding in a vehicle. Do not drive: If you have been drinking alcohol. Do not ride with someone who has been drinking. When you are tired or distracted. While texting. Wear a helmet and other protective equipment during sports activities. If you have firearms in your house, make sure you follow all gun safety procedures. What's next? Go to your health care provider once a year for an annual wellness visit. Ask your health care provider how often you should have your eyes and teeth checked. Stay up to date on all vaccines. This information is not intended to replace advice given to you by your health care provider. Make sure you discuss any questions you have with your healthcare provider. Document Revised: 03/19/2019 Document Reviewed: 06/14/2018 Elsevier Patient Education  2022 Elsevier Inc.  

## 2020-12-17 NOTE — Progress Notes (Signed)
BP 132/84   Pulse 80   Temp 98.2 F (36.8 C) (Oral)   Resp 16   Ht '5\' 8"'$  (1.727 m)   Wt 225 lb (102.1 kg)   SpO2 97%   BMI 34.21 kg/m    Subjective:    Patient ID: Jason Leonard, male    DOB: 1970-10-14, 50 y.o.   MRN: [id number]  HPI: Jason Leonard is a 50 y.o. male  Chief Complaint  Patient presents with   Annual Exam   Hypertension Using medications without difficulty Average home BPs starting to come down from the 140s-130s with eating better   No problems or lightheadedness No chest pain with exertion or shortness of breath No Edema   Hyperlipidemia Using medications without problems: No Muscle aches  Diet compliance: Stopped sugar and a lot of carbs Exercise:Active job  CKD: Stage 3.  Stable over several years.    Social History   Socioeconomic History   Marital status: Married    Spouse name: Not on file   Number of children: 2   Years of education: Not on file   Highest education level: Not on file  Occupational History   Not on file  Tobacco Use   Smoking status: Never   Smokeless tobacco: Never  Vaping Use   Vaping Use: Never used  Substance and Sexual Activity   Alcohol use: No   Drug use: No   Sexual activity: Yes    Comment: one partner wife - 2011  Other Topics Concern   Not on file  Social History Narrative   Not on file   Social Determinants of Health   Financial Resource Strain: Low Risk    Difficulty of Paying Living Expenses: Not hard at all  Food Insecurity: No Food Insecurity   Worried About Charity fundraiser in the Last Year: Never true   [place] in the Last Year: Never true  Transportation Needs: No Transportation Needs   Lack of Transportation (Medical): No   Lack of Transportation (Non-Medical): No  Physical Activity: Sufficiently Active   Days of Exercise per Week: 5 days   Minutes of Exercise per Session: 60 min  Stress: No Stress Concern Present   Feeling of Stress : Not at all  Social Connections:  Socially Integrated   Frequency of Communication with Friends and Family: More than three times a week   Frequency of Social Gatherings with Friends and Family: Twice a week   Attends Religious Services: More than 4 times per year   Active Member of [employer] or Organizations: Yes   Attends Music therapist: More than 4 times per year   Marital Status: Married  Human resources officer Violence: Not At Risk   Fear of Current or Ex-Partner: No   Emotionally Abused: No   Physically Abused: No   Sexually Abused: No   Family History  Problem Relation Age of Onset   Hypertension Mother    Hypertension Father    Lupus Sister    Diabetes Maternal Grandfather    Cancer Maternal Grandfather    Cancer Paternal Grandmother    Past Medical History:  Diagnosis Date   Hyperlipidemia    low HDL, high LDL - no meds, diet controlled   Hypertension    Past Surgical History:  Procedure Laterality Date   ARTHROSCOPIC REPAIR ACL     COLONOSCOPY WITH PROPOFOL N/A 01/09/2020   Procedure: COLONOSCOPY WITH PROPOFOL;  Surgeon: Jonathon Bellows, MD;  Location: [facility] ENDOSCOPY;  Service: Gastroenterology;  Laterality: N/A;   WISDOM TOOTH EXTRACTION       Relevant past medical, surgical, family and social history reviewed and updated as indicated. Interim medical history since our last visit reviewed. Allergies and medications reviewed and updated.  Review of Systems  Constitutional: Negative.   HENT: Negative.    Eyes: Negative.   Respiratory: Negative.    Cardiovascular: Negative.   Gastrointestinal: Negative.   Endocrine: Negative.   Genitourinary: Negative.   Musculoskeletal: Negative.   Skin: Negative.   Allergic/Immunologic: Negative.   Neurological: Negative.   Hematological: Negative.   Psychiatric/Behavioral: Negative.    All other systems reviewed and are negative.  Per HPI unless specifically indicated above     Objective:    BP 132/84   Pulse 80   Temp 98.2 F (36.8 C) (Oral)    Resp 16   Ht '5\' 8"'$  (1.727 m)   Wt 225 lb (102.1 kg)   SpO2 97%   BMI 34.21 kg/m   Wt Readings from Last 3 Encounters:  12/17/20 225 lb (102.1 kg)  09/15/20 225 lb 6.4 oz (102.2 kg)  08/31/20 225 lb 1.6 oz (102.1 kg)    Physical Exam Constitutional:      Appearance: He is well-developed.  HENT:     Head: Normocephalic.     Right Ear: Tympanic membrane, ear canal and external ear normal.     Left Ear: Tympanic membrane, ear canal and external ear normal.     Mouth/Throat:     Pharynx: Uvula midline.  Eyes:     Pupils: Pupils are equal, round, and reactive to light.  Cardiovascular:     Rate and Rhythm: Normal rate and regular rhythm.     Heart sounds: Normal heart sounds. No murmur heard.   No friction rub. No gallop.  Pulmonary:     Effort: Pulmonary effort is normal. No respiratory distress.     Breath sounds: Normal breath sounds.  Abdominal:     General: Bowel sounds are normal. There is no distension.     Palpations: Abdomen is soft.     Tenderness: There is no abdominal tenderness.  Musculoskeletal:        General: Normal range of motion.  Skin:    General: Skin is warm and dry.  Neurological:     Mental Status: He is alert and oriented to person, place, and time.     Deep Tendon Reflexes: Reflexes are normal and symmetric.  Psychiatric:        Behavior: Behavior normal.        Thought Content: Thought content normal.        Judgment: Judgment normal.    Results for orders placed or performed in visit on [id number]  Basic Metabolic Panel (BMET)  Result Value Ref Range   Glucose, Bld 94 65 - 99 mg/dL   BUN 17 7 - 25 mg/dL   Creat 1.49 (H) 0.60 - 1.35 mg/dL   BUN/Creatinine Ratio 11 6 - 22 (calc)   Sodium 142 135 - 146 mmol/L   Potassium 4.2 3.5 - 5.3 mmol/L   Chloride 104 98 - 110 mmol/L   CO2 32 20 - 32 mmol/L   Calcium 9.4 8.6 - 10.3 mg/dL      Assessment & Plan:   Problem List Items Addressed This Visit       Unprioritized   Hyperlipidemia     Check lipid panel today.         Relevant Orders  Lipid panel   Hypertension    Stable, continue present medications.         Relevant Orders   COMPLETE METABOLIC PANEL WITH GFR   Obesity (BMI 30-39.9)    Working on diet.         Relevant Orders   Hemoglobin A1c   Other Visit Diagnoses     Adult general medical exam    -  Primary   Relevant Orders   COMPLETE METABOLIC PANEL WITH GFR   Lipid panel   CBC w/Diff/Platelet   Encounter for medication monitoring       Relevant Orders   COMPLETE METABOLIC PANEL WITH GFR   Lipid panel   Screening for HIV without presence of risk factors       Relevant Orders   HIV antibody (with reflex)   Screening for prostate cancer           HM: Colonoscopy up to date Covid up to date Delay shingles, td due to upcoming vacation.     Follow up plan: Return in about 6 months (around 06/18/2021).

## 2021-03-25 NOTE — Progress Notes (Signed)
Left vm

## 2021-04-01 LAB — CBC WITH DIFFERENTIAL/PLATELET
Absolute Monocytes: 301 cells/uL (ref 200–950)
Basophils Absolute: 30 cells/uL (ref 0–200)
Basophils Relative: 0.7 %
Eosinophils Absolute: 39 cells/uL (ref 15–500)
Eosinophils Relative: 0.9 %
HCT: 42.8 % (ref 38.5–50.0)
Hemoglobin: 14.1 g/dL (ref 13.2–17.1)
Lymphs Abs: 2184 cells/uL (ref 850–3900)
MCH: 28.8 pg (ref 27.0–33.0)
MCHC: 32.9 g/dL (ref 32.0–36.0)
MCV: 87.5 fL (ref 80.0–100.0)
MPV: 12.7 fL — ABNORMAL HIGH (ref 7.5–12.5)
Monocytes Relative: 7 %
Neutro Abs: 1746 cells/uL (ref 1500–7800)
Neutrophils Relative %: 40.6 %
Platelets: 138 10*3/uL — ABNORMAL LOW (ref 140–400)
RBC: 4.89 10*6/uL (ref 4.20–5.80)
RDW: 12.4 % (ref 11.0–15.0)
Total Lymphocyte: 50.8 %
WBC: 4.3 10*3/uL (ref 3.8–10.8)

## 2021-04-01 LAB — LIPID PANEL
Cholesterol: 144 mg/dL (ref <200)
HDL: 56 mg/dL (ref >=40)
LDL Cholesterol (Calc): 66 mg/dL (calc)
Non-HDL Cholesterol (Calc): 88 mg/dL (calc) (ref <130)
Total CHOL/HDL Ratio: 2.6 (calc) (ref <5.0)
Triglycerides: 135 mg/dL (ref <150)

## 2021-04-01 LAB — COMPLETE METABOLIC PANEL WITH GFR
AG Ratio: 1.5 (calc) (ref 1.0–2.5)
ALT: 22 U/L (ref 9–46)
AST: 35 U/L (ref 10–35)
Albumin: 4.1 g/dL (ref 3.6–5.1)
Alkaline phosphatase (APISO): 38 U/L (ref 35–144)
BUN/Creatinine Ratio: 14 (calc) (ref 6–22)
BUN: 21 mg/dL (ref 7–25)
CO2: 30 mmol/L (ref 20–32)
Calcium: 9.3 mg/dL (ref 8.6–10.3)
Chloride: 105 mmol/L (ref 98–110)
Creat: 1.5 mg/dL — ABNORMAL HIGH (ref 0.70–1.30)
Globulin: 2.7 g/dL (calc) (ref 1.9–3.7)
Glucose, Bld: 84 mg/dL (ref 65–99)
Potassium: 4.2 mmol/L (ref 3.5–5.3)
Sodium: 142 mmol/L (ref 135–146)
Total Bilirubin: 0.6 mg/dL (ref 0.2–1.2)
Total Protein: 6.8 g/dL (ref 6.1–8.1)
eGFR: 56 mL/min/{1.73_m2} — ABNORMAL LOW (ref >=60)

## 2021-04-01 LAB — HEMOGLOBIN A1C
Hgb A1c MFr Bld: 5.6 % of total Hgb (ref <5.7)
Mean Plasma Glucose: 114 mg/dL
eAG (mmol/L): 6.3 mmol/L

## 2021-04-01 LAB — HIV ANTIBODY (ROUTINE TESTING W REFLEX): HIV 1&2 Ab, 4th Generation: NONREACTIVE

## 2021-06-02 ENCOUNTER — Encounter: Payer: Self-pay | Admitting: Family Medicine

## 2021-06-09 ENCOUNTER — Ambulatory Visit (INDEPENDENT_AMBULATORY_CARE_PROVIDER_SITE_OTHER): Payer: Managed Care, Other (non HMO) | Admitting: Internal Medicine

## 2021-06-09 ENCOUNTER — Other Ambulatory Visit: Payer: Self-pay

## 2021-06-09 ENCOUNTER — Encounter: Payer: Self-pay | Admitting: Internal Medicine

## 2021-06-09 VITALS — BP 155/100 | HR 85 | Temp 97.7°F | Resp 16 | Ht 68.0 in | Wt 235.8 lb

## 2021-06-09 DIAGNOSIS — R109 Unspecified abdominal pain: Secondary | ICD-10-CM | POA: Diagnosis not present

## 2021-06-09 DIAGNOSIS — I10 Essential (primary) hypertension: Secondary | ICD-10-CM | POA: Diagnosis not present

## 2021-06-09 DIAGNOSIS — M545 Low back pain, unspecified: Secondary | ICD-10-CM | POA: Diagnosis not present

## 2021-06-09 LAB — POCT URINALYSIS DIPSTICK
Bilirubin, UA: NEGATIVE
Blood, UA: NEGATIVE
Glucose, UA: NEGATIVE
Ketones, UA: NEGATIVE
Leukocytes, UA: NEGATIVE
Nitrite, UA: NEGATIVE
Odor: NORMAL
Protein, UA: NEGATIVE
Spec Grav, UA: 1.02 (ref 1.010–1.025)
Urobilinogen, UA: 0.2 E.U./dL
pH, UA: 6 (ref 5.0–8.0)

## 2021-06-09 NOTE — Patient Instructions (Addendum)
It was great seeing you today!  Plan discussed at today's visit: -Blood work ordered today, results will be uploaded to MyChart.  -Continue Lisinopril 30 mg daily, check blood pressure once a week, record this and we will review at follow up -No UTI, no need for antibiotics today  Follow up in: 6 weeks   Take care and let us know if you have any questions or concerns prior to your next visit.  Dr. Rosana Berger

## 2021-06-09 NOTE — Progress Notes (Signed)
Acute Office Visit  Subjective:    Patient ID: Jason Leonard, male    DOB: [birth year], 50 y.o.   MRN: [id number]  Chief Complaint  Patient presents with   Back Pain    Rt side/flank    HPI Patient is in today for concerns for UTI. Was having lower back spasms last week. He works for [employer] and is very active with lifting heavy objects regularly. The spasms started last week but have resolved at this point. He also denies balc pain or decreased ROM.   URINARY SYMPTOMS Dysuria: no Urinary frequency: no Urgency: no Small volume voids: no Foul odor: no Hematuria: no Abdominal pain: no Back pain:  last week, resolved now Suprapubic pain/pressure: no Flank pain: no Fever:  no Vomiting: no  Hypertension: -Medications: Lisinopril 30 mg -Patient is compliant with above medications and reports no side effects. -Checking BP at home (average): Has a cuff but not checking -Denies any SOB, CP, vision changes, LE edema or symptoms of hypotension  Past Medical History:  Diagnosis Date   Hyperlipidemia    low HDL, high LDL - no meds, diet controlled   Hypertension     Past Surgical History:  Procedure Laterality Date   ARTHROSCOPIC REPAIR ACL     COLONOSCOPY WITH PROPOFOL N/A 01/09/2020   Procedure: COLONOSCOPY WITH PROPOFOL;  Surgeon: Jonathon Bellows, MD;  Location: [facility] ENDOSCOPY;  Service: Gastroenterology;  Laterality: N/A;   WISDOM TOOTH EXTRACTION      Family History  Problem Relation Age of Onset   Hypertension Mother    Hypertension Father    Lupus Sister    Diabetes Maternal Grandfather    Cancer Maternal Grandfather    Cancer Paternal Grandmother     Social History   Socioeconomic History   Marital status: Married    Spouse name: Not on file   Number of children: 2   Years of education: Not on file   Highest education level: Not on file  Occupational History   Not on file  Tobacco Use   Smoking status: Never   Smokeless tobacco: Never  Vaping Use   Vaping  Use: Never used  Substance and Sexual Activity   Alcohol use: No   Drug use: No   Sexual activity: Yes    Comment: one partner wife - 2011  Other Topics Concern   Not on file  Social History Narrative   Not on file   Social Determinants of Health   Financial Resource Strain: Low Risk    Difficulty of Paying Living Expenses: Not hard at all  Food Insecurity: No Food Insecurity   Worried About Charity fundraiser in the Last Year: Never true   [place] in the Last Year: Never true  Transportation Needs: No Transportation Needs   Lack of Transportation (Medical): No   Lack of Transportation (Non-Medical): No  Physical Activity: Sufficiently Active   Days of Exercise per Week: 5 days   Minutes of Exercise per Session: 60 min  Stress: No Stress Concern Present   Feeling of Stress : Not at all  Social Connections: Socially Integrated   Frequency of Communication with Friends and Family: More than three times a week   Frequency of Social Gatherings with Friends and Family: Twice a week   Attends Religious Services: More than 4 times per year   Active Member of [employer] or Organizations: Yes   Attends Archivist Meetings: More than 4 times per year  Marital Status: Married  Human resources officer Violence: Not At Risk   Fear of Current or Ex-Partner: No   Emotionally Abused: No   Physically Abused: No   Sexually Abused: No    Outpatient Medications Prior to Visit  Medication Sig Dispense Refill   lisinopril (ZESTRIL) 30 MG tablet Take 1 tablet (30 mg total) by mouth daily. 90 tablet 1   simvastatin (ZOCOR) 20 MG tablet Take 1 tablet (20 mg total) by mouth at bedtime. 90 tablet 1   No facility-administered medications prior to visit.    No Known Allergies  Review of Systems  Constitutional:  Negative for chills and fever.  Eyes:  Negative for visual disturbance.  Respiratory:  Negative for cough and shortness of breath.   Cardiovascular:  Negative for chest pain.   Genitourinary:  Negative for dysuria, flank pain, frequency, hematuria and urgency.  Musculoskeletal:  Negative for back pain and gait problem.  Neurological:  Negative for dizziness, weakness and headaches.      Objective:    Physical Exam Constitutional:      Appearance: Normal appearance.  HENT:     Head: Normocephalic and atraumatic.  Eyes:     Conjunctiva/sclera: Conjunctivae normal.  Cardiovascular:     Rate and Rhythm: Normal rate and regular rhythm.  Pulmonary:     Effort: Pulmonary effort is normal.     Breath sounds: Normal breath sounds.  Musculoskeletal:     Right lower leg: No edema.     Left lower leg: No edema.  Skin:    General: Skin is warm and dry.  Neurological:     General: No focal deficit present.     Mental Status: He is alert. Mental status is at baseline.  Psychiatric:        Mood and Affect: Mood normal.        Behavior: Behavior normal.    BP (!) 155/100   Pulse 85   Temp 97.7 F (36.5 C)   Resp 16   Ht '5\' 8"'  (1.727 m)   Wt 235 lb 12.8 oz (107 kg)   SpO2 98%   BMI 35.85 kg/m  Wt Readings from Last 3 Encounters:  12/17/20 225 lb (102.1 kg)  09/15/20 225 lb 6.4 oz (102.2 kg)  08/31/20 225 lb 1.6 oz (102.1 kg)    Health Maintenance Due  Topic Date Due   TETANUS/TDAP  Never done   COVID-19 Vaccine (2 - Pfizer series) 12/06/2019   Zoster Vaccines- Shingrix (1 of 2) Never done   INFLUENZA VACCINE  Never done    There are no preventive care reminders to display for this patient.   No results found for: TSH Lab Results  Component Value Date   WBC 4.3 03/31/2021   HGB 14.1 03/31/2021   HCT 42.8 03/31/2021   MCV 87.5 03/31/2021   PLT 138 (L) 03/31/2021   Lab Results  Component Value Date   NA 142 03/31/2021   K 4.2 03/31/2021   CO2 30 03/31/2021   GLUCOSE 84 03/31/2021   BUN 21 03/31/2021   CREATININE 1.50 (H) 03/31/2021   BILITOT 0.6 03/31/2021   AST 35 03/31/2021   ALT 22 03/31/2021   PROT 6.8 03/31/2021   CALCIUM  9.3 03/31/2021   EGFR 56 (L) 03/31/2021   Lab Results  Component Value Date   CHOL 144 03/31/2021   Lab Results  Component Value Date   HDL 56 03/31/2021   Lab Results  Component Value Date   LDLCALC 66  03/31/2021   Lab Results  Component Value Date   TRIG 135 03/31/2021   Lab Results  Component Value Date   CHOLHDL 2.6 03/31/2021   Lab Results  Component Value Date   HGBA1C 5.6 03/31/2021       Assessment & Plan:   1. Acute right-sided low back pain without sciatica/Flank pain: In office UA negative for signs of infection. No urinary symptoms, back spasms resolved. We did discuss using a back brace when lifting heavy objects to protect the back, using NSAIDs/muscle relaxers as needed and stretching the back.   - POCT Urinalysis Dipstick  2. Hypertension, unspecified type: Blood pressure initially 178/98, recheck was 155/100. He states he is nervous being at the doctor's office today and had been running late and stressed. He does take Lisinopril 30 mg daily. Discussed starting to check BP at home, recording these and following up in 6 weeks for blood pressure recheck.    Teodora Medici, DO

## 2021-06-22 ENCOUNTER — Ambulatory Visit: Payer: Managed Care, Other (non HMO) | Admitting: Family Medicine

## 2021-07-21 ENCOUNTER — Ambulatory Visit: Payer: Managed Care, Other (non HMO)

## 2021-07-21 VITALS — BP 160/98

## 2021-07-21 DIAGNOSIS — I10 Essential (primary) hypertension: Secondary | ICD-10-CM

## 2021-07-21 NOTE — Progress Notes (Signed)
Pt here for 6 week blood pressure check, currently on Lisinopril 30mg ,  Blood pressure today after sitting for 5 mins was 160/98.  Pt advised would be in touch on what she wanted to to.

## 2021-08-11 ENCOUNTER — Encounter: Payer: Self-pay | Admitting: Internal Medicine

## 2021-08-11 ENCOUNTER — Ambulatory Visit (INDEPENDENT_AMBULATORY_CARE_PROVIDER_SITE_OTHER): Payer: Managed Care, Other (non HMO) | Admitting: Internal Medicine

## 2021-08-11 VITALS — BP 155/90 | HR 72 | Temp 98.4°F | Resp 16 | Ht 68.0 in | Wt 233.8 lb

## 2021-08-11 DIAGNOSIS — E782 Mixed hyperlipidemia: Secondary | ICD-10-CM | POA: Diagnosis not present

## 2021-08-11 DIAGNOSIS — I10 Essential (primary) hypertension: Secondary | ICD-10-CM

## 2021-08-11 MED ORDER — SIMVASTATIN 20 MG PO TABS: 20.0000 mg | ORAL_TABLET | Freq: Every day | ORAL | 1 refills | Status: DC

## 2021-08-11 MED ORDER — LISINOPRIL 30 MG PO TABS: 30.0000 mg | ORAL_TABLET | Freq: Every day | ORAL | 1 refills | Status: DC

## 2021-08-11 MED ORDER — HYDROCHLOROTHIAZIDE 12.5 MG PO CAPS: 12.5000 mg | ORAL_CAPSULE | Freq: Every day | ORAL | 1 refills | Status: DC

## 2021-08-11 NOTE — Assessment & Plan Note (Signed)
Blood pressure still not controlled, continue Lisinopril 30 with HCTZ 12.5 added. He will continue to check blood pressure at home. Of note, last creatinine elevated at 1.50 in September, will repeat at follow up in 1 month after being on diuretic.

## 2021-08-11 NOTE — Patient Instructions (Addendum)
It was great seeing you today!  Plan discussed at today's visit: -Continue Lisinopril, now start HCTZ 12.5 mg as well, can take at the same time -Continue to check BP at home  Follow up in: 1 month  Take care and let us know if you have any questions or concerns prior to your next visit.  Dr. Rosana Berger  DASH Eating Plan DASH stands for Dietary Approaches to Stop Hypertension. The DASH eating plan is a healthy eating plan that has been shown to: Reduce high blood pressure (hypertension). Reduce your risk for type 2 diabetes, heart disease, and stroke. Help with weight loss. What are tips for following this plan? Reading food labels Check food labels for the amount of salt (sodium) per serving. Choose foods with less than 5 percent of the Daily Value of sodium. Generally, foods with less than 300 milligrams (mg) of sodium per serving fit into this eating plan. To find whole grains, look for the word "whole" as the first word in the ingredient list. Shopping Buy products labeled as "low-sodium" or "no salt added." Buy fresh foods. Avoid canned foods and pre-made or frozen meals. Cooking Avoid adding salt when cooking. Use salt-free seasonings or herbs instead of table salt or sea salt. Check with your health care provider or pharmacist before using salt substitutes. Do not fry foods. Cook foods using healthy methods such as baking, boiling, grilling, roasting, and broiling instead. Cook with heart-healthy oils, such as olive, canola, avocado, soybean, or sunflower oil. Meal planning Eat a balanced diet that includes: 4 or more servings of fruits and 4 or more servings of vegetables each day. Try to fill one-half of your plate with fruits and vegetables. 6-8 servings of whole grains each day. Less than 6 oz (170 g) of lean meat, poultry, or fish each day. A 3-oz (85-g) serving of meat is about the same size as a deck of cards. One egg equals 1 oz (28 g). 2-3 servings of low-fat dairy each  day. One serving is 1 cup (237 mL). 1 serving of nuts, seeds, or beans 5 times each week. 2-3 servings of heart-healthy fats. Healthy fats called omega-3 fatty acids are found in foods such as walnuts, flaxseeds, fortified milks, and eggs. These fats are also found in cold-water fish, such as sardines, salmon, and mackerel. Limit how much you eat of: Canned or prepackaged foods. Food that is high in trans fat, such as some fried foods. Food that is high in saturated fat, such as fatty meat. Desserts and other sweets, sugary drinks, and other foods with added sugar. Full-fat dairy products. Do not salt foods before eating. Do not eat more than 4 egg yolks a week. Try to eat at least 2 vegetarian meals a week. Eat more home-cooked food and less restaurant, buffet, and fast food. Lifestyle When eating at a restaurant, ask that your food be prepared with less salt or no salt, if possible. If you drink alcohol: Limit how much you use to: 0-1 drink a day for women who are not pregnant. 0-2 drinks a day for men. Be aware of how much alcohol is in your drink. In the [place] one drink equals one 12 oz bottle of beer (355 mL), one 5 oz glass of wine (148 mL), or one 1 oz glass of hard liquor (44 mL). General information Avoid eating more than 2,300 mg of salt a day. If you have hypertension, you may need to reduce your sodium intake to 1,500 mg a day.  Work with your health care provider to maintain a healthy body weight or to lose weight. Ask what an ideal weight is for you. Get at least 30 minutes of exercise that causes your heart to beat faster (aerobic exercise) most days of the week. Activities may include walking, swimming, or biking. Work with your health care provider or dietitian to adjust your eating plan to your individual calorie needs. What foods should I eat? Fruits All fresh, dried, or frozen fruit. Canned fruit in natural juice (without added sugar). Vegetables Fresh or frozen  vegetables (raw, steamed, roasted, or grilled). Low-sodium or reduced-sodium tomato and vegetable juice. Low-sodium or reduced-sodium tomato sauce and tomato paste. Low-sodium or reduced-sodium canned vegetables. Grains Whole-grain or whole-wheat bread. Whole-grain or whole-wheat pasta. Brown rice. Jason Leonard. Bulgur. Whole-grain and low-sodium cereals. Pita bread. Low-fat, low-sodium crackers. Whole-wheat flour tortillas. Meats and other proteins Skinless chicken or [place]. Ground chicken or [place]. Pork with fat trimmed off. Fish and seafood. Egg whites. Dried beans, peas, or lentils. Unsalted nuts, nut butters, and seeds. Unsalted canned beans. Lean cuts of beef with fat trimmed off. Low-sodium, lean precooked or cured meat, such as sausages or meat loaves. Dairy Low-fat (1%) or fat-free (skim) milk. Reduced-fat, low-fat, or fat-free cheeses. Nonfat, low-sodium ricotta or cottage cheese. Low-fat or nonfat yogurt. Low-fat, low-sodium cheese. Fats and oils Soft margarine without trans fats. Vegetable oil. Reduced-fat, low-fat, or light mayonnaise and salad dressings (reduced-sodium). Canola, safflower, olive, avocado, soybean, and sunflower oils. Avocado. Seasonings and condiments Herbs. Spices. Seasoning mixes without salt. Other foods Unsalted popcorn and pretzels. Fat-free sweets. The items listed above may not be a complete list of foods and beverages you can eat. Contact a dietitian for more information. What foods should I avoid? Fruits Canned fruit in a light or heavy syrup. Fried fruit. Fruit in cream or butter sauce. Vegetables Creamed or fried vegetables. Vegetables in a cheese sauce. Regular canned vegetables (not low-sodium or reduced-sodium). Regular canned tomato sauce and paste (not low-sodium or reduced-sodium). Regular tomato and vegetable juice (not low-sodium or reduced-sodium). Jason Leonard. Olives. Grains Baked goods made with fat, such as croissants, muffins, or some  breads. Dry pasta or rice meal packs. Meats and other proteins Fatty cuts of meat. Ribs. Fried meat. Jason Leonard. Bologna, salami, and other precooked or cured meats, such as sausages or meat loaves. Fat from the back of a pig (fatback). Bratwurst. Salted nuts and seeds. Canned beans with added salt. Canned or smoked fish. Whole eggs or egg yolks. Chicken or [place] with skin. Dairy Whole or 2% milk, cream, and half-and-half. Whole or full-fat cream cheese. Whole-fat or sweetened yogurt. Full-fat cheese. Nondairy creamers. Whipped toppings. Processed cheese and cheese spreads. Fats and oils Butter. Stick margarine. Lard. Shortening. Ghee. Bacon fat. Tropical oils, such as coconut, palm kernel, or palm oil. Seasonings and condiments Onion salt, garlic salt, seasoned salt, table salt, and sea salt. Worcestershire sauce. Tartar sauce. Barbecue sauce. Teriyaki sauce. Soy sauce, including reduced-sodium. Steak sauce. Canned and packaged gravies. Fish sauce. Oyster sauce. Cocktail sauce. Store-bought horseradish. Ketchup. Mustard. Meat flavorings and tenderizers. Bouillon cubes. Hot sauces. Pre-made or packaged marinades. Pre-made or packaged taco seasonings. Relishes. Regular salad dressings. Other foods Salted popcorn and pretzels. The items listed above may not be a complete list of foods and beverages you should avoid. Contact a dietitian for more information. Where to find more information National Heart, Lung, and Blood Institute: https://wilson-eaton.com/ American Heart Association: www.heart.org Academy of Nutrition and Dietetics: www.eatright.[place]:  www.kidney.org Summary The DASH eating plan is a healthy eating plan that has been shown to reduce high blood pressure (hypertension). It may also reduce your risk for type 2 diabetes, heart disease, and stroke. When on the DASH eating plan, aim to eat more fresh fruits and vegetables, whole grains, lean proteins, low-fat dairy, and  heart-healthy fats. With the DASH eating plan, you should limit salt (sodium) intake to 2,300 mg a day. If you have hypertension, you may need to reduce your sodium intake to 1,500 mg a day. Work with your health care provider or dietitian to adjust your eating plan to your individual calorie needs. This information is not intended to replace advice given to you by your health care provider. Make sure you discuss any questions you have with your health care provider. Document Revised: 05/24/2019 Document Reviewed

## 2021-08-11 NOTE — Assessment & Plan Note (Signed)
Stable, continue statin 

## 2021-08-11 NOTE — Progress Notes (Signed)
Established Patient Office Visit  Subjective:  Patient ID: Jason Leonard, male    DOB: 14-Jul-1970  Age: 51 y.o. MRN: [id number]  CC:  Chief Complaint  Patient presents with   Follow-up   Hypertension    HPI Jason Leonard presents for follow up on blood pressure.  Hypertension: -Medications: Lisinopril 30 mg -Patient is compliant with above medications and reports no side effects. -Checking BP at home (average): 149/89 average -Denies any SOB, CP, vision changes, LE edema or symptoms of hypotension  HLD: -Medications: Zocor 20 -Patient is compliant with above medications and reports no side effects.  -Last lipid panel: 9/22 Lipid Panel     Component Value Date/Time   CHOL 144 03/31/2021 1029   TRIG 135 03/31/2021 1029   HDL 56 03/31/2021 1029   CHOLHDL 2.6 03/31/2021 1029   Vernal 66 03/31/2021 1029   The 10-year ASCVD risk score (Arnett DK, et al., 2019) is: 11.6%   Values used to calculate the score:     Age: 67 years     Sex: Male     Is Non-Hispanic African American: Yes     Diabetic: No     Tobacco smoker: No     Systolic Blood Pressure: 662 mmHg     Is BP treated: Yes     HDL Cholesterol: 56 mg/dL     Total Cholesterol: 144 mg/dL   Past Medical History:  Diagnosis Date   Hyperlipidemia    low HDL, high LDL - no meds, diet controlled   Hypertension     Past Surgical History:  Procedure Laterality Date   ARTHROSCOPIC REPAIR ACL     COLONOSCOPY WITH PROPOFOL N/A 01/09/2020   Procedure: COLONOSCOPY WITH PROPOFOL;  Surgeon: Jonathon Bellows, MD;  Location: [facility] ENDOSCOPY;  Service: Gastroenterology;  Laterality: N/A;   WISDOM TOOTH EXTRACTION      Family History  Problem Relation Age of Onset   Hypertension Mother    Hypertension Father    Lupus Sister    Diabetes Maternal Grandfather    Cancer Maternal Grandfather    Cancer Paternal Grandmother     Social History   Socioeconomic History   Marital status: Married    Spouse name: Not on file    Number of children: 2   Years of education: Not on file   Highest education level: Not on file  Occupational History   Not on file  Tobacco Use   Smoking status: Never   Smokeless tobacco: Never  Vaping Use   Vaping Use: Never used  Substance and Sexual Activity   Alcohol use: No   Drug use: No   Sexual activity: Yes    Comment: one partner wife - 2011  Other Topics Concern   Not on file  Social History Narrative   Not on file   Social Determinants of Health   Financial Resource Strain: Low Risk    Difficulty of Paying Living Expenses: Not hard at all  Food Insecurity: No Food Insecurity   Worried About Charity fundraiser in the Last Year: Never true   [place] in the Last Year: Never true  Transportation Needs: No Transportation Needs   Lack of Transportation (Medical): No   Lack of Transportation (Non-Medical): No  Physical Activity: Sufficiently Active   Days of Exercise per Week: 5 days   Minutes of Exercise per Session: 60 min  Stress: No Stress Concern Present   Feeling of Stress : Not at all  Social  Connections: Socially Integrated   Frequency of Communication with Friends and Family: More than three times a week   Frequency of Social Gatherings with Friends and Family: Twice a week   Attends Religious Services: More than 4 times per year   Active Member of [employer] or Organizations: Yes   Attends Music therapist: More than 4 times per year   Marital Status: Married  Human resources officer Violence: Not At Risk   Fear of Current or Ex-Partner: No   Emotionally Abused: No   Physically Abused: No   Sexually Abused: No    Outpatient Medications Prior to Visit  Medication Sig Dispense Refill   lisinopril (ZESTRIL) 30 MG tablet Take 1 tablet (30 mg total) by mouth daily. 90 tablet 1   simvastatin (ZOCOR) 20 MG tablet Take 1 tablet (20 mg total) by mouth at bedtime. 90 tablet 1   No facility-administered medications prior to visit.    No Known  Allergies  ROS Review of Systems  Constitutional:  Negative for chills and fever.  Eyes:  Negative for visual disturbance.  Respiratory:  Negative for cough and shortness of breath.   Cardiovascular:  Negative for chest pain.  Neurological:  Negative for dizziness and headaches.     Objective:    Physical Exam Constitutional:      Appearance: Normal appearance.  HENT:     Head: Normocephalic and atraumatic.  Eyes:     Conjunctiva/sclera: Conjunctivae normal.  Cardiovascular:     Rate and Rhythm: Normal rate and regular rhythm.  Pulmonary:     Effort: Pulmonary effort is normal.     Breath sounds: Normal breath sounds.  Musculoskeletal:     Right lower leg: No edema.     Left lower leg: No edema.  Skin:    General: Skin is warm and dry.  Neurological:     General: No focal deficit present.     Mental Status: He is alert. Mental status is at baseline.  Psychiatric:        Mood and Affect: Mood normal.        Behavior: Behavior normal.    BP (!) 155/90    Pulse 72    Temp 98.4 F (36.9 C)    Resp 16    Ht _0  (1.727 m)    Wt 233 lb 12.8 oz (106.1 kg)    SpO2 98%    BMI 35.55 kg/m   Vitals:   08/11/21 0857 08/11/21 0948  BP: (!) 170/104 (!) 155/90    Wt Readings from Last 3 Encounters:  08/11/21 233 lb 12.8 oz (106.1 kg)  06/09/21 235 lb 12.8 oz (107 kg)  12/17/20 225 lb (102.1 kg)     Health Maintenance Due  Topic Date Due   COVID-19 Vaccine (2 - Pfizer series) 12/06/2019   Zoster Vaccines- Shingrix (1 of 2) Never done    There are no preventive care reminders to display for this patient.  No results found for: TSH Lab Results  Component Value Date   WBC 4.3 03/31/2021   HGB 14.1 03/31/2021   HCT 42.8 03/31/2021   MCV 87.5 03/31/2021   PLT 138 (L) 03/31/2021   Lab Results  Component Value Date   NA 142 03/31/2021   K 4.2 03/31/2021   CO2 30 03/31/2021   GLUCOSE 84 03/31/2021   BUN 21 03/31/2021   CREATININE 1.50 (H) 03/31/2021   BILITOT  0.6 03/31/2021   AST 35 03/31/2021   ALT 22 03/31/2021  PROT 6.8 03/31/2021   CALCIUM 9.3 03/31/2021   EGFR 56 (L) 03/31/2021   Lab Results  Component Value Date   CHOL 144 03/31/2021   Lab Results  Component Value Date   HDL 56 03/31/2021   Lab Results  Component Value Date   LDLCALC 66 03/31/2021   Lab Results  Component Value Date   TRIG 135 03/31/2021   Lab Results  Component Value Date   CHOLHDL 2.6 03/31/2021   Lab Results  Component Value Date   HGBA1C 5.6 03/31/2021      Assessment & Plan:   Problem List Items Addressed This Visit       Cardiovascular and Mediastinum   Hypertension - Primary    Blood pressure still not controlled, continue Lisinopril 30 with HCTZ 12.5 added. He will continue to check blood pressure at home. Of note, last creatinine elevated at 1.50 in September, will repeat at follow up in 1 month after being on diuretic.       Relevant Medications   hydrochlorothiazide (MICROZIDE) 12.5 MG capsule   lisinopril (ZESTRIL) 30 MG tablet   simvastatin (ZOCOR) 20 MG tablet     Other   Hyperlipidemia    Stable, continue statin.      Relevant Medications   hydrochlorothiazide (MICROZIDE) 12.5 MG capsule   lisinopril (ZESTRIL) 30 MG tablet   simvastatin (ZOCOR) 20 MG tablet     Follow-up: Return in about 4 weeks (around 09/08/2021).    Teodora Medici, DO

## 2021-08-27 ENCOUNTER — Other Ambulatory Visit: Payer: Self-pay

## 2021-09-02 ENCOUNTER — Ambulatory Visit (INDEPENDENT_AMBULATORY_CARE_PROVIDER_SITE_OTHER): Payer: Managed Care, Other (non HMO) | Admitting: Urology

## 2021-09-02 ENCOUNTER — Other Ambulatory Visit: Payer: Self-pay

## 2021-09-02 ENCOUNTER — Encounter: Payer: Self-pay | Admitting: Urology

## 2021-09-02 VITALS — BP 167/104 | HR 68 | Ht 68.0 in | Wt 225.0 lb

## 2021-09-02 DIAGNOSIS — R361 Hematospermia: Secondary | ICD-10-CM | POA: Diagnosis not present

## 2021-09-02 DIAGNOSIS — Z125 Encounter for screening for malignant neoplasm of prostate: Secondary | ICD-10-CM

## 2021-09-02 DIAGNOSIS — R972 Elevated prostate specific antigen [PSA]: Secondary | ICD-10-CM

## 2021-09-02 NOTE — Patient Instructions (Signed)
Prostate Cancer Screening ?Prostate cancer screening is testing that is done to check for the presence of prostate cancer in men. The prostate gland is a walnut-sized gland that is located below the bladder and in front of the rectum in males. The function of the prostate is to add fluid to semen during ejaculation. Prostate cancer is one of the most common types of cancer in men. ?Who should have prostate cancer screening? ?Screening recommendations vary based on age and other risk factors, as well as between the professional organizations who make the recommendations. ?In general, screening is recommended if: ?You are age 50 to 70 and have an average risk for prostate cancer. You should talk with your health care provider about your need for screening and how often screening should be done. Because most prostate cancers are slow growing and will not cause death, screening in this age group is generally reserved for men who have a 10- to 15-year life expectancy. ?You are younger than age 50, and you have these risk factors: ?Having a father, brother, or uncle who has been diagnosed with prostate cancer. The risk is higher if your family member's cancer occurred at an early age or if you have multiple family members with prostate cancer at an early age. ?Being a male who is Black or is of Caribbean or sub-Saharan African descent. ?In general, screening is not recommended if: ?You are younger than age 40. ?You are between the ages of 40 and 49 and you have no risk factors. ?You are 70 years of age or older. At this age, the risks that screening can cause are greater than the benefits that it may provide. ?If you are at high risk for prostate cancer, your health care provider may recommend that you have screenings more often or that you start screening at a younger age. ?How is screening for prostate cancer done? ?The recommended prostate cancer screening test is a blood test called the prostate-specific antigen (PSA)  test. PSA is a protein that is made in the prostate. As you age, your prostate naturally produces more PSA. Abnormally high PSA levels may be caused by: ?Prostate cancer. ?An enlarged prostate that is not caused by cancer (benign prostatic hyperplasia, or BPH). This condition is very common in older men. ?A prostate gland infection (prostatitis) or urinary tract infection. ?Certain medicines such as male hormones (like testosterone) or other medicines that raise testosterone levels. ?A rectal exam may be done as part of prostate cancer screening to help provide information about the size of your prostate gland. When a rectal exam is performed, it should be done after the PSA level is drawn to avoid any effect on the results. ?Depending on the PSA results, you may need more tests, such as: ?A physical exam to check the size of your prostate gland, if not done as part of screening. ?Blood and imaging tests. ?A procedure to remove tissue samples from your prostate gland for testing (biopsy). This is the only way to know for certain if you have prostate cancer. ?What are the benefits of prostate cancer screening? ?Screening can help to identify cancer at an early stage, before symptoms start and when the cancer can be treated more easily. ?There is a small chance that screening may lower your risk of dying from prostate cancer. The chance is small because prostate cancer is a slow-growing cancer, and most men with prostate cancer die from a different cause. ?What are the risks of prostate cancer screening? ?The main   risk of prostate cancer screening is diagnosing and treating prostate cancer that would never have caused any symptoms or problems. This is called overdiagnosisand overtreatment. PSA screening cannot tell you if your PSA is high due to cancer or a different cause. A prostate biopsy is the only procedure to diagnose prostate cancer. Even the results of a biopsy may not tell you if your cancer needs to be  treated. Slow-growing prostate cancer may not need any treatment other than monitoring, so diagnosing and treating it may cause unnecessary stress or other side effects. ?Questions to ask your health care provider ?When should I start prostate cancer screening? ?What is my risk for prostate cancer? ?How often do I need screening? ?What type of screening tests do I need? ?How do I get my test results? ?What do my results mean? ?Do I need treatment? ?Where to find more information ?The American Cancer Society: www.cancer.org ?American Urological Association: www.auanet.org ?Contact a health care provider if: ?You have difficulty urinating. ?You have pain when you urinate or ejaculate. ?You have blood in your urine or semen. ?You have pain in your back or in the area of your prostate. ?Summary ?Prostate cancer is a common type of cancer in men. The prostate gland is located below the bladder and in front of the rectum. This gland adds fluid to semen during ejaculation. ?Prostate cancer screening may identify cancer at an early stage, when the cancer can be treated more easily and is less likely to have spread to other areas of the body. ?The prostate-specific antigen (PSA) test is the recommended screening test for prostate cancer, but it has associated risks. ?Discuss the risks and benefits of prostate cancer screening with your health care provider. If you are age 70 or older, the risks that screening can cause are greater than the benefits that it may provide. ?This information is not intended to replace advice given to you by your health care provider. Make sure you discuss any questions you have with your health care provider. ?Document Revised: 12/14/2020 Document Reviewed: 12/14/2020 ?Elsevier Patient Education ? 2022 Elsevier Inc. ? ?

## 2021-09-02 NOTE — Progress Notes (Signed)
? ?  09/02/2021 ?5:11 PM  ? ?Lars Masson ?1971-04-29 ?[id number] ? ?Reason for visit: Follow up hematospermia, PSA screening ? ?HPI: ?51 year old male who I originally saw in February 2022 for hematospermia.  Work-up with DRE, UA, and PSA were benign.  He has had no further hematospermia since that time.  Prior PSA was normal at 2.5, and he denies any family history of prostate cancer.  No recent PSA to review.  He denies any urinary symptoms, gross hematuria, or ED. ? ?Reassurance provided regarding him at his primary that this is almost always a benign cause, and certainly reassuring that this has resolved.  We also reviewed the AUA guidelines regarding PSA screening, and he is amenable to PSA today.  If PSA normal can continue PSA screening with PCP and follow-up with urology as needed. ? ?Call with PSA results, if normal can follow-up with PCP ? ?Billey Co, MD ? ?[facility] ?[address], [address] ?[place], [place] [address] ?[phone] ? ?

## 2021-09-03 LAB — PSA: Prostate Specific Ag, Serum: 2.5 ng/mL (ref 0.0–4.0)

## 2021-09-08 ENCOUNTER — Encounter: Payer: Self-pay | Admitting: Internal Medicine

## 2021-09-08 ENCOUNTER — Ambulatory Visit (INDEPENDENT_AMBULATORY_CARE_PROVIDER_SITE_OTHER): Payer: Managed Care, Other (non HMO) | Admitting: Internal Medicine

## 2021-09-08 VITALS — BP 142/85 | HR 76 | Temp 98.1°F | Resp 16 | Ht 68.0 in | Wt 230.7 lb

## 2021-09-08 DIAGNOSIS — E782 Mixed hyperlipidemia: Secondary | ICD-10-CM | POA: Diagnosis not present

## 2021-09-08 DIAGNOSIS — I10 Essential (primary) hypertension: Secondary | ICD-10-CM

## 2021-09-08 NOTE — Assessment & Plan Note (Signed)
Blood pressure high initially, recheck 142/85 which is still above goal. Recheck BMP today after starting HCTZ, plan to increase dose to 25 mg after results. Considering secondary causes of HTN, denies OSA symptoms but consider sleep study if BP remains uncontrolled. Follow up in 1 month for recheck.  ?

## 2021-09-08 NOTE — Assessment & Plan Note (Signed)
Stable, continue current medications.  

## 2021-09-08 NOTE — Patient Instructions (Signed)
It was great seeing you today! ? ?Plan discussed at today's visit: ?-Blood work ordered today, results will be uploaded to [facility].  ?-Take medication when you first wake up, continue to check blood pressure at home in the afternoon ?-If kidney function and electrolytes look good, will send in higher dose of HCTZ 25 mg  ? ?Follow up in: 1 month  ? ?Take care and let us know if you have any questions or concerns prior to your next visit. ? ?Dr. Rosana Berger ? ?

## 2021-09-08 NOTE — Progress Notes (Addendum)
? ?Established Patient Office Visit ? ?Subjective:  ?Patient ID: Jason Leonard, male    DOB: 03/12/1971  Age: 51 y.o. MRN: [id number] ? ?CC:  ?Chief Complaint  ?Patient presents with  ? Follow-up  ? Hypertension  ? ? ?HPI ?Jason Leonard presents for follow-up on chronic medical conditions.  ? ?Hypertension: ?-Medications: Lisinopril 30 mg, HCTZ 12.5 mg daily added at last office visit ?-Patient is compliant with above medications and reports no side effects. ?-Checking BP at home (average): 164/89 - 148/77 ?-Denies any SOB, CP, vision changes, LE edema or symptoms of hypotension. Sleep has been good, does snore, no witnessed apneic events, no morning tiredness, no headaches.  ?-Wakes up at 1 am and goes to sleep at 3-4 pm; takes Lisinopril-HCTZ 1 pm  ?  ?HLD: ?-Medications: Zocor 20 ?-Patient is compliant with above medications and reports no side effects.  ?-Last lipid panel: 9/22 ?Lipid Panel  ?   ?Component Value Date/Time  ? CHOL 144 03/31/2021 1029  ? TRIG 135 03/31/2021 1029  ? HDL 56 03/31/2021 1029  ? CHOLHDL 2.6 03/31/2021 1029  ? [place] 66 03/31/2021 1029  ? ?Health maintenance: ?-Blood work, recheck BMP ?-Colonoscopy: 7/21, repeat in 10 years ? ?Past Medical History:  ?Diagnosis Date  ? Hyperlipidemia   ? low HDL, high LDL - no meds, diet controlled  ? Hypertension   ? ? ?Past Surgical History:  ?Procedure Laterality Date  ? ARTHROSCOPIC REPAIR ACL    ? COLONOSCOPY WITH PROPOFOL N/A 01/09/2020  ? Procedure: COLONOSCOPY WITH PROPOFOL;  Surgeon: Jonathon Bellows, MD;  Location[facility] ENDOSCOPY;  Service: Gastroenterology;  Laterality: N/A;  ? WISDOM TOOTH EXTRACTION    ? ? ?Family History  ?Problem Relation Age of Onset  ? Hypertension Mother   ? Hypertension Father   ? Lupus Sister   ? Diabetes Maternal Grandfather   ? Cancer Maternal Grandfather   ? Cancer Paternal Grandmother   ? ? ?Social History  ? ?Socioeconomic History  ? Marital status: Married  ?  Spouse name: Not on file  ? Number of children: 2  ? Years of  education: Not on file  ? Highest education level: Not on file  ?Occupational History  ? Not on file  ?Tobacco Use  ? Smoking status: Never  ? Smokeless tobacco: Never  ?Vaping Use  ? Vaping Use: Never used  ?Substance and Sexual Activity  ? Alcohol use: No  ? Drug use: No  ? Sexual activity: Yes  ?  Comment: one partner wife - 2011  ?Other Topics Concern  ? Not on file  ?Social History Narrative  ? Not on file  ? ?Social Determinants of Health  ? ?Financial Resource Strain: Low Risk   ? Difficulty of Paying Living Expenses: Not hard at all  ?Food Insecurity: No Food Insecurity  ? Worried About Charity fundraiser in the Last Year: Never true  ? Ran Out of Food in the Last Year: Never true  ?Transportation Needs: No Transportation Needs  ? Lack of Transportation (Medical): No  ? Lack of Transportation (Non-Medical): No  ?Physical Activity: Sufficiently Active  ? Days of Exercise per Week: 5 days  ? Minutes of Exercise per Session: 60 min  ?Stress: No Stress Concern Present  ? Feeling of Stress : Not at all  ?Social Connections: Socially Integrated  ? Frequency of Communication with Friends and Family: More than three times a week  ? Frequency of Social Gatherings with Friends and Family: Twice a week  ?  Attends Religious Services: More than 4 times per year  ? Active Member of Clubs or Organizations: Yes  ? Attends Archivist Meetings: More than 4 times per year  ? Marital Status: Married  ?Intimate Partner Violence: Not At Risk  ? Fear of Current or Ex-Partner: No  ? Emotionally Abused: No  ? Physically Abused: No  ? Sexually Abused: No  ? ? ?Outpatient Medications Prior to Visit  ?Medication Sig Dispense Refill  ? hydrochlorothiazide (MICROZIDE) 12.5 MG capsule Take 1 capsule (12.5 mg total) by mouth daily. 30 capsule 1  ? lisinopril (ZESTRIL) 30 MG tablet Take 1 tablet (30 mg total) by mouth daily. 90 tablet 1  ? simvastatin (ZOCOR) 20 MG tablet Take 1 tablet (20 mg total) by mouth at bedtime. 90 tablet  1  ? ?No facility-administered medications prior to visit.  ? ? ?No Known Allergies ? ?ROS ?Review of Systems  ?Constitutional:  Negative for chills and fever.  ?Eyes:  Negative for visual disturbance.  ?Respiratory:  Negative for cough and shortness of breath.   ?Cardiovascular:  Negative for chest pain.  ?Neurological:  Negative for dizziness, light-headedness and headaches.  ? ?  ?Objective:  ?  ?Physical Exam ?Constitutional:   ?   Appearance: Normal appearance.  ?HENT:  ?   Head: Normocephalic and atraumatic.  ?Eyes:  ?   Conjunctiva/sclera: Conjunctivae normal.  ?Cardiovascular:  ?   Rate and Rhythm: Normal rate and regular rhythm.  ?Pulmonary:  ?   Effort: Pulmonary effort is normal.  ?   Breath sounds: Normal breath sounds.  ?Musculoskeletal:  ?   Right lower leg: No edema.  ?   Left lower leg: No edema.  ?Skin: ?   General: Skin is warm and dry.  ?Neurological:  ?   General: No focal deficit present.  ?   Mental Status: He is alert. Mental status is at baseline.  ?Psychiatric:     ?   Mood and Affect: Mood normal.     ?   Behavior: Behavior normal.  ? ? ?BP (!) 142/85   Pulse 76   Temp 98.1 ?F (36.7 ?C)   Resp 16   Ht '5\' 8"'  (1.727 m)   Wt 230 lb 11.2 oz (104.6 kg)   SpO2 98%   BMI 35.08 kg/m?  ?Wt Readings from Last 3 Encounters:  ?09/02/21 225 lb (102.1 kg)  ?08/11/21 233 lb 12.8 oz (106.1 kg)  ?06/09/21 235 lb 12.8 oz (107 kg)  ? ? ? ?Health Maintenance Due  ?Topic Date Due  ? COVID-19 Vaccine (2 - Pfizer series) 12/06/2019  ? Zoster Vaccines- Shingrix (1 of 2) Never done  ? ? ?There are no preventive care reminders to display for this patient. ? ?No results found for: TSH ?Lab Results  ?Component Value Date  ? WBC 4.3 03/31/2021  ? HGB 14.1 03/31/2021  ? HCT 42.8 03/31/2021  ? MCV 87.5 03/31/2021  ? PLT 138 (L) 03/31/2021  ? ?Lab Results  ?Component Value Date  ? NA 142 03/31/2021  ? K 4.2 03/31/2021  ? CO2 30 03/31/2021  ? GLUCOSE 84 03/31/2021  ? BUN 21 03/31/2021  ? CREATININE 1.50 (H)  03/31/2021  ? BILITOT 0.6 03/31/2021  ? AST 35 03/31/2021  ? ALT 22 03/31/2021  ? PROT 6.8 03/31/2021  ? CALCIUM 9.3 03/31/2021  ? EGFR 56 (L) 03/31/2021  ? ?Lab Results  ?Component Value Date  ? CHOL 144 03/31/2021  ? ?Lab Results  ?Component Value Date  ?  HDL 56 03/31/2021  ? ?Lab Results  ?Component Value Date  ? [place] 66 03/31/2021  ? ?Lab Results  ?Component Value Date  ? TRIG 135 03/31/2021  ? ?Lab Results  ?Component Value Date  ? CHOLHDL 2.6 03/31/2021  ? ?Lab Results  ?Component Value Date  ? HGBA1C 5.6 03/31/2021  ? ? ?  ?Assessment & Plan:  ? ?Problem List Items Addressed This Visit   ? ?  ? Cardiovascular and Mediastinum  ? Hypertension - Primary  ?  Blood pressure high initially, recheck 142/85 which is still above goal. Recheck BMP today after starting HCTZ, plan to increase dose to 25 mg after results. Considering secondary causes of HTN, denies OSA symptoms but consider sleep study if BP remains uncontrolled. Follow up in 1 month for recheck.  ?  ?  ? Relevant Orders  ? Basic Metabolic Panel (BMET)  ?  ? Other  ? Hyperlipidemia  ?  Stable, continue current medications. ? ?  ?  ? ? ?No orders of the defined types were placed in this encounter. ? ? ?Follow-up: Return in about 4 weeks (around 10/06/2021).  ? ? ?Teodora Medici, DO ?

## 2021-09-09 LAB — BASIC METABOLIC PANEL
BUN/Creatinine Ratio: 16 (calc) (ref 6–22)
BUN: 24 mg/dL (ref 7–25)
CO2: 29 mmol/L (ref 20–32)
Calcium: 9.5 mg/dL (ref 8.6–10.3)
Chloride: 106 mmol/L (ref 98–110)
Creat: 1.54 mg/dL — ABNORMAL HIGH (ref 0.70–1.30)
Glucose, Bld: 79 mg/dL (ref 65–99)
Potassium: 4 mmol/L (ref 3.5–5.3)
Sodium: 143 mmol/L (ref 135–146)

## 2021-09-13 MED ORDER — LISINOPRIL 40 MG PO TABS: 40.0000 mg | ORAL_TABLET | Freq: Every day | ORAL | 3 refills | Status: DC

## 2021-09-13 NOTE — Addendum Note (Signed)
Addended by: Teodora Medici on: 09/13/2021 12:53 PM ? ? Modules accepted: Orders ? ?

## 2021-09-20 ENCOUNTER — Ambulatory Visit (INDEPENDENT_AMBULATORY_CARE_PROVIDER_SITE_OTHER): Payer: Managed Care, Other (non HMO) | Admitting: Orthopedic Surgery

## 2021-09-20 ENCOUNTER — Ambulatory Visit (INDEPENDENT_AMBULATORY_CARE_PROVIDER_SITE_OTHER): Payer: Managed Care, Other (non HMO)

## 2021-09-20 DIAGNOSIS — M79672 Pain in left foot: Secondary | ICD-10-CM

## 2021-09-20 DIAGNOSIS — M6702 Short Achilles tendon (acquired), left ankle: Secondary | ICD-10-CM

## 2021-09-20 DIAGNOSIS — L98491 Non-pressure chronic ulcer of skin of other sites limited to breakdown of skin: Secondary | ICD-10-CM | POA: Diagnosis not present

## 2021-09-21 ENCOUNTER — Encounter: Payer: Self-pay | Admitting: Orthopedic Surgery

## 2021-09-21 NOTE — Progress Notes (Signed)
? ?Office Visit Note ?  ?Patient: Jason Leonard           ?Date of Birth: 14-Jul-1970           ?MRN: [id number] ?Visit Date: 09/20/2021 ?             ?Requested by: Delsa Grana, PA-C ?[place] ?[address] ?[place],  [place] [address] ?PCP: Delsa Grana, PA-C ? ?Chief Complaint  ?Patient presents with  ? Left Foot - Follow-up  ?  Knot on left 5th toe  ? ? ? ? ?HPI: ?Patient is a 51 year old gentleman who presents complaining of a painful callus plantar aspect fifth metatarsal head left foot.  Patient states this started about a month and a half ago patient denies any pain when he has his shoe on. ? ?Assessment & Plan: ?Visit Diagnoses:  ?1. Left foot pain   ?2. Callous ulcer, limited to breakdown of skin ([place])   ?3. Contracture of left Achilles tendon   ? ? ?Plan: Callus was pared patient was given instructions and demonstrated Achilles stretching recommended a stiff soled sneaker to unload the fifth metatarsal head. ? ?Follow-Up Instructions: Return if symptoms worsen or fail to improve.  ? ?Ortho Exam ? ?Patient is alert, oriented, no adenopathy, well-dressed, normal affect, normal respiratory effort. ?Examination patient has good pulses he has good ankle and subtalar motion with his knee extended he has dorsiflexion the ankle only to neutral with Achilles contracture.  There are no palpable defects or nodules along the Achilles.  Patient has a callus beneath the fifth metatarsal head secondary to the Achilles contracture.  After informed consent the callus was pared there is no deep ulcers no infection. ? ?Imaging: ?XR Foot Complete Left ? ?Result Date: 09/21/2021 ?Three-view radiographs of the left foot shows no destructive bony changes no fractures joint spaces are congruent.  There is well-circumscribed calcification distal tibia syndesmosis.  No lytic destructive bony changes.  ?No images are attached to the encounter. ? ?Labs: ?Lab Results  ?Component Value Date  ? HGBA1C 5.6 03/31/2021  ? HGBA1C 5.6  06/12/2018  ? ? ? ?No results found for: ALBUMIN, PREALBUMIN, CBC ? ?Lab Results  ?Component Value Date  ? MG 2.1 08/15/2019  ? ?No results found for: VD25OH ? ?No results found for: PREALBUMIN ?CBC EXTENDED Latest Ref Rng & Units 03/31/2021 12/10/2019 08/15/2019  ?WBC 3.8 - 10.8 Thousand/uL 4.3 4.8 5.3  ?RBC 4.20 - 5.80 Million/uL 4.89 5.04 5.01  ?HGB 13.2 - 17.1 g/dL 14.1 14.1 14.0  ?HCT 38.5 - 50.0 % 42.8 43.9 43.0  ?PLT 140 - 400 Thousand/uL 138(L) 126(L) 137(L)  ?NEUTROABS 1,500 - 7,800 cells/uL 1,746 1,790 2,300  ?LYMPHSABS 850 - 3,900 cells/uL 2,184 2,635 2,618  ? ? ? ?There is no height or weight on file to calculate BMI. ? ?Orders:  ?Orders Placed This Encounter  ?Procedures  ? XR Foot Complete Left  ? ?No orders of the defined types were placed in this encounter. ? ? ? Procedures: ?No procedures performed ? ?Clinical Data: ?No additional findings. ? ?ROS: ? ?All other systems negative, except as noted in the HPI. ?Review of Systems ? ?Objective: ?Vital Signs: There were no vitals taken for this visit. ? ?Specialty Comments:  ?No specialty comments available. ? ?PMFS History: ?Patient Active Problem List  ? Diagnosis Date Noted  ? Obesity (BMI 30-39.9) 12/17/2020  ? Hypertension 06/14/2018  ? Hyperlipidemia 06/14/2018  ? ?Past Medical History:  ?Diagnosis Date  ? Hyperlipidemia   ? low  HDL, high LDL - no meds, diet controlled  ? Hypertension   ?  ?Family History  ?Problem Relation Age of Onset  ? Hypertension Mother   ? Hypertension Father   ? Lupus Sister   ? Diabetes Maternal Grandfather   ? Cancer Maternal Grandfather   ? Cancer Paternal Grandmother   ?  ?Past Surgical History:  ?Procedure Laterality Date  ? ARTHROSCOPIC REPAIR ACL    ? COLONOSCOPY WITH PROPOFOL N/A 01/09/2020  ? Procedure: COLONOSCOPY WITH PROPOFOL;  Surgeon: Jonathon Bellows, MD;  Location: [facility] ENDOSCOPY;  Service: Gastroenterology;  Laterality: N/A;  ? WISDOM TOOTH EXTRACTION    ? ?Social History  ? ?Occupational History  ? Not on file   ?Tobacco Use  ? Smoking status: Never  ? Smokeless tobacco: Never  ?Vaping Use  ? Vaping Use: Never used  ?Substance and Sexual Activity  ? Alcohol use: No  ? Drug use: No  ? Sexual activity: Yes  ?  Comment: one partner wife - 2011  ? ? ? ? ? ?

## 2021-10-20 ENCOUNTER — Ambulatory Visit: Payer: Managed Care, Other (non HMO) | Admitting: Internal Medicine

## 2021-11-02 ENCOUNTER — Other Ambulatory Visit: Payer: Self-pay | Admitting: Internal Medicine

## 2021-11-02 DIAGNOSIS — I10 Essential (primary) hypertension: Secondary | ICD-10-CM

## 2021-11-02 NOTE — Telephone Encounter (Signed)
Requested Prescriptions  ?Pending Prescriptions Disp Refills  ?? hydrochlorothiazide (MICROZIDE) 12.5 MG capsule [Pharmacy Med Name: hydroCHLOROthiazide 12.5 MG Oral Capsule] 30 capsule 0  ?  Sig: Take 1 capsule by mouth once daily  ?  ? Cardiovascular: Diuretics - Thiazide Failed - 11/02/2021 11:13 AM  ?  ?  Failed - Cr in normal range and within 180 days  ?  Creat  ?Date Value Ref Range Status  ?09/08/2021 1.54 (H) 0.70 - 1.30 mg/dL Final  ?   ?  ?  Failed - Last BP in normal range  ?  BP Readings from Last 1 Encounters:  ?09/08/21 (!) 142/85  ?   ?  ?  Passed - K in normal range and within 180 days  ?  Potassium  ?Date Value Ref Range Status  ?09/08/2021 4.0 3.5 - 5.3 mmol/L Final  ?   ?  ?  Passed - Na in normal range and within 180 days  ?  Sodium  ?Date Value Ref Range Status  ?09/08/2021 143 135 - 146 mmol/L Final  ?   ?  ?  Passed - Valid encounter within last 6 months  ?  Recent Outpatient Visits   ?      ? 1 month ago Hypertension, unspecified type  ? [place], DO  ? 2 months ago Hypertension, unspecified type  ? [facility] Teodora Medici, DO  ? 4 months ago Acute right-sided low back pain without sciatica  ? [facility] Teodora Medici, DO  ? 10 months ago Adult general medical exam  ? [facility] Kathrine Haddock, NP  ? 1 year ago Hypertension, unspecified type  ? [place], DO  ?  ?  ?Future Appointments   ?        ? In 2 weeks Delsa Grana, PA-C [facility]  ?  ? ?  ?  ?  ? ? ?

## 2021-11-22 ENCOUNTER — Ambulatory Visit: Payer: Managed Care, Other (non HMO) | Admitting: Family Medicine

## 2022-03-15 ENCOUNTER — Ambulatory Visit: Payer: Managed Care, Other (non HMO) | Admitting: Nurse Practitioner

## 2022-03-15 NOTE — Progress Notes (Unsigned)
Established Patient Office Visit  Subjective:  Patient ID: Jason Leonard, male    DOB: 10/18/1970  Age: 51 y.o. MRN: [id number]  CC:  No chief complaint on file.   HPI Jason Leonard presents for follow-up on chronic medical conditions.   Hypertension: -Medications: Lisinopril 40 mg, HCTZ 12.5 mg  -Patient is compliant with above medications and reports no side effects. -Checking BP at home (average): 164/89 - 148/77 -Denies any SOB, CP, vision changes, LE edema or symptoms of hypotension. Sleep has been good, does snore, no witnessed apneic events, no morning tiredness, no headaches.  -Wakes up at 1 am and goes to sleep at 3-4 pm; takes Lisinopril-HCTZ 1 pm    HLD: -Medications: Zocor 20 mg -Patient is compliant with above medications and reports no side effects.  -Last lipid panel: 9/22 Lipid Panel     Component Value Date/Time   CHOL 144 03/31/2021 1029   TRIG 135 03/31/2021 1029   HDL 56 03/31/2021 1029   CHOLHDL 2.6 03/31/2021 1029   LDLCALC 66 03/31/2021 1029   Health maintenance: -Blood work due -Colonoscopy: 7/21, repeat in 10 years  Past Medical History:  Diagnosis Date   Hyperlipidemia    low HDL, high LDL - no meds, diet controlled   Hypertension     Past Surgical History:  Procedure Laterality Date   ARTHROSCOPIC REPAIR ACL     COLONOSCOPY WITH PROPOFOL N/A 01/09/2020   Procedure: COLONOSCOPY WITH PROPOFOL;  Surgeon: Jonathon Bellows, MD;  Location: [facility] ENDOSCOPY;  Service: Gastroenterology;  Laterality: N/A;   WISDOM TOOTH EXTRACTION      Family History  Problem Relation Age of Onset   Hypertension Mother    Hypertension Father    Lupus Sister    Diabetes Maternal Grandfather    Cancer Maternal Grandfather    Cancer Paternal Grandmother     Social History   Socioeconomic History   Marital status: Married    Spouse name: Not on file   Number of children: 2   Years of education: Not on file   Highest education level: Not on file  Occupational  History   Not on file  Tobacco Use   Smoking status: Never   Smokeless tobacco: Never  Vaping Use   Vaping Use: Never used  Substance and Sexual Activity   Alcohol use: No   Drug use: No   Sexual activity: Yes    Comment: one partner wife - 2011  Other Topics Concern   Not on file  Social History Narrative   Not on file   Social Determinants of Health   Financial Resource Strain: Low Risk  (12/17/2020)   Overall Financial Resource Strain (CARDIA)    Difficulty of Paying Living Expenses: Not hard at all  Food Insecurity: No Food Insecurity (12/17/2020)   Hunger Vital Sign    Worried About Running Out of Food in the Last Year: Never true    [place] in the Last Year: Never true  Transportation Needs: No Transportation Needs (12/17/2020)   PRAPARE - Hydrologist (Medical): No    Lack of Transportation (Non-Medical): No  Physical Activity: Sufficiently Active (12/17/2020)   Exercise Vital Sign    Days of Exercise per Week: 5 days    Minutes of Exercise per Session: 60 min  Stress: No Stress Concern Present (12/17/2020)   [place]    Feeling of Stress : Not at all  Social Connections: Socially Integrated (12/17/2020)   Social Connection and Isolation Panel [NHANES]    Frequency of Communication with Friends and Family: More than three times a week    Frequency of Social Gatherings with Friends and Family: Twice a week    Attends Religious Services: More than 4 times per year    Active Member of [employer] or Organizations: Yes    Attends Music therapist: More than 4 times per year    Marital Status: Married  Human resources officer Violence: Not At Risk (12/17/2020)   Humiliation, Afraid, Rape, and Kick questionnaire    Fear of Current or Ex-Partner: No    Emotionally Abused: No    Physically Abused: No    Sexually Abused: No    Outpatient Medications Prior to Visit   Medication Sig Dispense Refill   hydrochlorothiazide (MICROZIDE) 12.5 MG capsule Take 1 capsule by mouth once daily 30 capsule 2   lisinopril (ZESTRIL) 40 MG tablet Take 1 tablet (40 mg total) by mouth daily. 90 tablet 3   simvastatin (ZOCOR) 20 MG tablet Take 1 tablet (20 mg total) by mouth at bedtime. 90 tablet 1   No facility-administered medications prior to visit.    No Known Allergies  ROS Review of Systems  Constitutional:  Negative for chills and fever.  Eyes:  Negative for visual disturbance.  Respiratory:  Negative for cough and shortness of breath.   Cardiovascular:  Negative for chest pain.  Neurological:  Negative for dizziness, light-headedness and headaches.      Objective:    Physical Exam Constitutional:      Appearance: Normal appearance.  HENT:     Head: Normocephalic and atraumatic.  Eyes:     Conjunctiva/sclera: Conjunctivae normal.  Cardiovascular:     Rate and Rhythm: Normal rate and regular rhythm.  Pulmonary:     Effort: Pulmonary effort is normal.     Breath sounds: Normal breath sounds.  Musculoskeletal:     Right lower leg: No edema.     Left lower leg: No edema.  Skin:    General: Skin is warm and dry.  Neurological:     General: No focal deficit present.     Mental Status: He is alert. Mental status is at baseline.  Psychiatric:        Mood and Affect: Mood normal.        Behavior: Behavior normal.     There were no vitals taken for this visit. Wt Readings from Last 3 Encounters:  09/08/21 230 lb 11.2 oz (104.6 kg)  09/02/21 225 lb (102.1 kg)  08/11/21 233 lb 12.8 oz (106.1 kg)     Health Maintenance Due  Topic Date Due   COVID-19 Vaccine (2 - Pfizer series) 01/10/2020   Zoster Vaccines- Shingrix (1 of 2) Never done   INFLUENZA VACCINE  Never done    There are no preventive care reminders to display for this patient.  No results found for: "TSH" Lab Results  Component Value Date   WBC 4.3 03/31/2021   HGB 14.1  03/31/2021   HCT 42.8 03/31/2021   MCV 87.5 03/31/2021   PLT 138 (L) 03/31/2021   Lab Results  Component Value Date   NA 143 09/08/2021   K 4.0 09/08/2021   CO2 29 09/08/2021   GLUCOSE 79 09/08/2021   BUN 24 09/08/2021   CREATININE 1.54 (H) 09/08/2021   BILITOT 0.6 03/31/2021   AST 35 03/31/2021   ALT 22 03/31/2021   PROT 6.8  03/31/2021   CALCIUM 9.5 09/08/2021   EGFR 56 (L) 03/31/2021   Lab Results  Component Value Date   CHOL 144 03/31/2021   Lab Results  Component Value Date   HDL 56 03/31/2021   Lab Results  Component Value Date   LDLCALC 66 03/31/2021   Lab Results  Component Value Date   TRIG 135 03/31/2021   Lab Results  Component Value Date   CHOLHDL 2.6 03/31/2021   Lab Results  Component Value Date   HGBA1C 5.6 03/31/2021      Assessment & Plan:   Problem List Items Addressed This Visit   None  No orders of the defined types were placed in this encounter.   Follow-up: No follow-ups on file.    Teodora Medici, DO

## 2022-03-16 ENCOUNTER — Encounter: Payer: Self-pay | Admitting: Internal Medicine

## 2022-03-16 ENCOUNTER — Ambulatory Visit (INDEPENDENT_AMBULATORY_CARE_PROVIDER_SITE_OTHER): Payer: Managed Care, Other (non HMO) | Admitting: Internal Medicine

## 2022-03-16 VITALS — BP 144/92 | HR 69 | Temp 97.8°F | Resp 16 | Ht 68.0 in | Wt 235.7 lb

## 2022-03-16 DIAGNOSIS — Z0289 Encounter for other administrative examinations: Secondary | ICD-10-CM

## 2022-03-16 DIAGNOSIS — Z23 Encounter for immunization: Secondary | ICD-10-CM

## 2022-03-16 DIAGNOSIS — E782 Mixed hyperlipidemia: Secondary | ICD-10-CM | POA: Diagnosis not present

## 2022-03-16 DIAGNOSIS — I10 Essential (primary) hypertension: Secondary | ICD-10-CM

## 2022-03-16 MED ORDER — SIMVASTATIN 20 MG PO TABS: 20.0000 mg | ORAL_TABLET | Freq: Every day | ORAL | 1 refills | Status: DC

## 2022-03-16 MED ORDER — HYDROCHLOROTHIAZIDE 12.5 MG PO CAPS: 12.5000 mg | ORAL_CAPSULE | Freq: Every day | ORAL | 2 refills | Status: DC

## 2022-03-16 NOTE — Patient Instructions (Signed)
It was great seeing you today!  Plan discussed at today's visit: -Blood work ordered today, results will be uploaded to [facility]. Please return fasting for 8-12 hours. Lab is open Monday-Friday 8-12 and 2-4.   Follow up in:  Take care and let us know if you have any questions or concerns prior to your next visit.  Dr. Rosana Berger

## 2022-03-22 ENCOUNTER — Encounter: Payer: Self-pay | Admitting: Internal Medicine

## 2022-03-23 LAB — COMPLETE METABOLIC PANEL WITH GFR
AG Ratio: 1.5 (calc) (ref 1.0–2.5)
ALT: 28 U/L (ref 9–46)
AST: 36 U/L — ABNORMAL HIGH (ref 10–35)
Albumin: 4.4 g/dL (ref 3.6–5.1)
Alkaline phosphatase (APISO): 37 U/L (ref 35–144)
BUN/Creatinine Ratio: 16 (calc) (ref 6–22)
BUN: 21 mg/dL (ref 7–25)
CO2: 30 mmol/L (ref 20–32)
Calcium: 9.4 mg/dL (ref 8.6–10.3)
Chloride: 104 mmol/L (ref 98–110)
Creat: 1.35 mg/dL — ABNORMAL HIGH (ref 0.70–1.30)
Globulin: 3 g/dL (calc) (ref 1.9–3.7)
Glucose, Bld: 87 mg/dL (ref 65–99)
Potassium: 3.9 mmol/L (ref 3.5–5.3)
Sodium: 141 mmol/L (ref 135–146)
Total Bilirubin: 0.8 mg/dL (ref 0.2–1.2)
Total Protein: 7.4 g/dL (ref 6.1–8.1)
eGFR: 64 mL/min/{1.73_m2} (ref >=60)

## 2022-03-23 LAB — CBC WITH DIFFERENTIAL/PLATELET
Absolute Monocytes: 273 cells/uL (ref 200–950)
Basophils Absolute: 20 cells/uL (ref 0–200)
Basophils Relative: 0.5 %
Eosinophils Absolute: 20 cells/uL (ref 15–500)
Eosinophils Relative: 0.5 %
HCT: 44.8 % (ref 38.5–50.0)
Hemoglobin: 14.7 g/dL (ref 13.2–17.1)
Lymphs Abs: 2153 cells/uL (ref 850–3900)
MCH: 28.3 pg (ref 27.0–33.0)
MCHC: 32.8 g/dL (ref 32.0–36.0)
MCV: 86.3 fL (ref 80.0–100.0)
MPV: 12.7 fL — ABNORMAL HIGH (ref 7.5–12.5)
Monocytes Relative: 7 %
Neutro Abs: 1435 cells/uL — ABNORMAL LOW (ref 1500–7800)
Neutrophils Relative %: 36.8 %
Platelets: 133 10*3/uL — ABNORMAL LOW (ref 140–400)
RBC: 5.19 10*6/uL (ref 4.20–5.80)
RDW: 12.9 % (ref 11.0–15.0)
Total Lymphocyte: 55.2 %
WBC: 3.9 10*3/uL (ref 3.8–10.8)

## 2022-03-23 LAB — LIPID PANEL
Cholesterol: 167 mg/dL (ref <200)
HDL: 61 mg/dL (ref >=40)
LDL Cholesterol (Calc): 91 mg/dL (calc)
Non-HDL Cholesterol (Calc): 106 mg/dL (calc) (ref <130)
Total CHOL/HDL Ratio: 2.7 (calc) (ref <5.0)
Triglycerides: 67 mg/dL (ref <150)

## 2022-03-23 MED ORDER — AMLODIPINE BESYLATE 2.5 MG PO TABS: 2.5000 mg | ORAL_TABLET | Freq: Every day | ORAL | 1 refills | Status: DC

## 2022-03-23 NOTE — Addendum Note (Signed)
Addended by: Teodora Medici on: 03/23/2022 05:00 PM   Modules accepted: Orders

## 2022-04-25 ENCOUNTER — Telehealth: Payer: Self-pay | Admitting: Internal Medicine

## 2022-04-25 DIAGNOSIS — I10 Essential (primary) hypertension: Secondary | ICD-10-CM

## 2022-04-26 NOTE — Telephone Encounter (Signed)
No longer current dosing of this medication Requested Prescriptions  Pending Prescriptions Disp Refills  . lisinopril (ZESTRIL) 30 MG tablet [Pharmacy Med Name: Lisinopril 30 MG Oral Tablet] 90 tablet 0    Sig: Take 1 tablet by mouth once daily     Cardiovascular:  ACE Inhibitors Failed - 04/25/2022  8:11 PM      Failed - Cr in normal range and within 180 days    Creat  Date Value Ref Range Status  03/22/2022 1.35 (H) 0.70 - 1.30 mg/dL Final         Failed - Last BP in normal range    BP Readings from Last 1 Encounters:  03/16/22 (!) 144/92         Passed - K in normal range and within 180 days    Potassium  Date Value Ref Range Status  03/22/2022 3.9 3.5 - 5.3 mmol/L Final         Passed - Patient is not pregnant      Passed - Valid encounter within last 6 months    Recent Outpatient Visits          1 month ago Hypertension, unspecified type   [place], DO   7 months ago Hypertension, unspecified type   [place], DO   8 months ago Hypertension, unspecified type   [facility] Teodora Medici, DO   10 months ago Acute right-sided low back pain without sciatica   [facility] Teodora Medici, DO   1 year ago Adult general medical exam   [facility] Kathrine Haddock, NP      Future Appointments            In 6 days Teodora Medici, [facility]

## 2022-05-01 NOTE — Progress Notes (Unsigned)
Established Patient Office Visit  Subjective:  Patient ID: Jason Leonard, male    DOB: 01-21-1971  Age: 51 y.o. MRN: [id number]  CC:  No chief complaint on file.   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   Hypertension: -Medications: Lisinopril 40 mg, HCTZ 12.5 mg, Amlodipine 2.5 mg added at night -Patient is compliant with above medications and reports no side effects. -Checking BP at home (average): 155/105 average but taking it after work  -Denies any SOB, CP, vision changes, LE edema or symptoms of hypotension. Sleep has been good, does snore, no witnessed apneic events, no morning tiredness, no headaches.  -Wakes up at 1 am and goes to sleep at 3-4 pm; takes Lisinopril-HCTZ 1 pm    HLD: -Medications: Zocor 20 mg -Patient is compliant with above medications and reports no side effects.  -Last lipid panel: 9/22 Lipid Panel     Component Value Date/Time   CHOL 167 03/22/2022 1057   TRIG 67 03/22/2022 1057   HDL 61 03/22/2022 1057   CHOLHDL 2.7 03/22/2022 1057   LDLCALC 91 03/22/2022 1057   Health maintenance: -Blood work UTD -Colonoscopy: 7/21, repeat in 10 years  Past Medical History:  Diagnosis Date   Hyperlipidemia    low HDL, high LDL - no meds, diet controlled   Hypertension     Past Surgical History:  Procedure Laterality Date   ARTHROSCOPIC REPAIR ACL     COLONOSCOPY WITH PROPOFOL N/A 01/09/2020   Procedure: COLONOSCOPY WITH PROPOFOL;  Surgeon: Jonathon Bellows, MD;  Location: [facility] ENDOSCOPY;  Service: Gastroenterology;  Laterality: N/A;   WISDOM TOOTH EXTRACTION      Family History  Problem Relation Age of Onset   Hypertension Mother    Hypertension Father    Lupus Sister    Diabetes Maternal Grandfather    Cancer Maternal Grandfather    Cancer Paternal Grandmother     Social History   Socioeconomic History   Marital status: Married    Spouse name: Not  on file   Number of children: 2   Years of education: Not on file   Highest education level: Not on file  Occupational History   Not on file  Tobacco Use   Smoking status: Never   Smokeless tobacco: Never  Vaping Use   Vaping Use: Never used  Substance and Sexual Activity   Alcohol use: No   Drug use: No   Sexual activity: Yes    Comment: one partner wife - 2011  Other Topics Concern   Not on file  Social History Narrative   Not on file   Social Determinants of Health   Financial Resource Strain: Low Risk  (12/17/2020)   Overall Financial Resource Strain (CARDIA)    Difficulty of Paying Living Expenses: Not hard at all  Food Insecurity: No Food Insecurity (12/17/2020)   Hunger Vital Sign    Worried About Running Out of Food in the Last Year: Never true    [place] in the Last Year: Never true  Transportation Needs: No Transportation Needs (12/17/2020)   PRAPARE - Hydrologist (Medical): No    Lack of Transportation (Non-Medical): No  Physical Activity: Sufficiently Active (12/17/2020)   Exercise Vital Sign    Days of Exercise per Week: 5 days    Minutes of Exercise per Session: 60 min  Stress: No Stress Concern Present (12/17/2020)  [place]    Feeling of Stress : Not at all  Social Connections: Socially Integrated (12/17/2020)   Social Connection and Isolation Panel [NHANES]    Frequency of Communication with Friends and Family: More than three times a week    Frequency of Social Gatherings with Friends and Family: Twice a week    Attends Religious Services: More than 4 times per year    Active Member of [employer] or Organizations: Yes    Attends Music therapist: More than 4 times per year    Marital Status: Married  Human resources officer Violence: Not At Risk (12/17/2020)   Humiliation, Afraid, Rape, and Kick questionnaire    Fear of Current or Ex-Partner: No     Emotionally Abused: No    Physically Abused: No    Sexually Abused: No    Outpatient Medications Prior to Visit  Medication Sig Dispense Refill   amLODipine (NORVASC) 2.5 MG tablet Take 1 tablet (2.5 mg total) by mouth daily. 30 tablet 1   hydrochlorothiazide (MICROZIDE) 12.5 MG capsule Take 1 capsule (12.5 mg total) by mouth daily. 30 capsule 2   lisinopril (ZESTRIL) 40 MG tablet Take 1 tablet (40 mg total) by mouth daily. 90 tablet 3   simvastatin (ZOCOR) 20 MG tablet Take 1 tablet (20 mg total) by mouth at bedtime. 90 tablet 1   No facility-administered medications prior to visit.    No Known Allergies  ROS Review of Systems  Constitutional:  Negative for chills and fever.  Eyes:  Negative for visual disturbance.  Respiratory:  Negative for cough and shortness of breath.   Cardiovascular:  Negative for chest pain.  Neurological:  Negative for dizziness, light-headedness and headaches.      Objective:    Physical Exam Constitutional:      Appearance: Normal appearance.  HENT:     Head: Normocephalic and atraumatic.  Eyes:     Conjunctiva/sclera: Conjunctivae normal.  Cardiovascular:     Rate and Rhythm: Normal rate and regular rhythm.  Pulmonary:     Effort: Pulmonary effort is normal.     Breath sounds: Normal breath sounds.  Musculoskeletal:     Right lower leg: No edema.     Left lower leg: No edema.     Comments: Strength testing 5/5 upper and lower extremities bilaterally   Skin:    General: Skin is warm and dry.  Neurological:     General: No focal deficit present.     Mental Status: He is alert. Mental status is at baseline.  Psychiatric:        Mood and Affect: Mood normal.        Behavior: Behavior normal.     There were no vitals taken for this visit. Wt Readings from Last 3 Encounters:  03/16/22 235 lb 11.2 oz (106.9 kg)  09/08/21 230 lb 11.2 oz (104.6 kg)  09/02/21 225 lb (102.1 kg)     Health Maintenance Due  Topic Date Due   COVID-19  Vaccine (2 - Pfizer series) 01/10/2020    There are no preventive care reminders to display for this patient.  No results found for: "TSH" Lab Results  Component Value Date   WBC 3.9 03/22/2022   HGB 14.7 03/22/2022   HCT 44.8 03/22/2022   MCV 86.3 03/22/2022   PLT 133 (L) 03/22/2022   Lab Results  Component Value Date   NA 141 03/22/2022   K 3.9 03/22/2022  CO2 30 03/22/2022   GLUCOSE 87 03/22/2022   BUN 21 03/22/2022   CREATININE 1.35 (H) 03/22/2022   BILITOT 0.8 03/22/2022   AST 36 (H) 03/22/2022   ALT 28 03/22/2022   PROT 7.4 03/22/2022   CALCIUM 9.4 03/22/2022   EGFR 64 03/22/2022   Lab Results  Component Value Date   CHOL 167 03/22/2022   Lab Results  Component Value Date   HDL 61 03/22/2022   Lab Results  Component Value Date   LDLCALC 91 03/22/2022   Lab Results  Component Value Date   TRIG 67 03/22/2022   Lab Results  Component Value Date   CHOLHDL 2.7 03/22/2022   Lab Results  Component Value Date   HGBA1C 5.6 03/31/2021      Assessment & Plan:   1. Hypertension, unspecified type: Blood pressure borderline today. Check CBC, CMP, if kidney function normal will plan to increase HCTZ to 25 mg or add a different medication. Then follow up in 1 month after new medication. Continue HCTZ 12.5 and Lisinopril 40 mg daily, refilled.   - CBC w/Diff/Platelet - COMPLETE METABOLIC PANEL WITH GFR - hydrochlorothiazide (MICROZIDE) 12.5 MG capsule; Take 1 capsule (12.5 mg total) by mouth daily.  Dispense: 30 capsule; Refill: 2  2. Mixed hyperlipidemia: Patient will return for fasting labs, continue Zocor 20 mg daily, refilled today.   - Lipid Profile - simvastatin (ZOCOR) 20 MG tablet; Take 1 tablet (20 mg total) by mouth at bedtime.  Dispense: 90 tablet; Refill: 1  3. Encounter for completion of form with patient: Physical completed for coaching, form filled out and returned to patient.   4. Need for diphtheria-tetanus-pertussis (Tdap) vaccine: Tdap  administered today.   - Tdap vaccine greater than or equal to 7yo IM   Follow-up: No follow-ups on file.    Teodora Medici, DO

## 2022-05-02 ENCOUNTER — Encounter: Payer: Self-pay | Admitting: Internal Medicine

## 2022-05-02 ENCOUNTER — Ambulatory Visit (INDEPENDENT_AMBULATORY_CARE_PROVIDER_SITE_OTHER): Payer: Managed Care, Other (non HMO) | Admitting: Internal Medicine

## 2022-05-02 VITALS — BP 155/100 | HR 80 | Temp 98.1°F | Resp 16 | Ht 68.0 in | Wt 238.4 lb

## 2022-05-02 DIAGNOSIS — I10 Essential (primary) hypertension: Secondary | ICD-10-CM | POA: Diagnosis not present

## 2022-05-02 DIAGNOSIS — E782 Mixed hyperlipidemia: Secondary | ICD-10-CM | POA: Diagnosis not present

## 2022-05-02 MED ORDER — LISINOPRIL 40 MG PO TABS: 40.0000 mg | ORAL_TABLET | Freq: Every day | ORAL | 1 refills | Status: DC

## 2022-05-02 MED ORDER — HYDROCHLOROTHIAZIDE 12.5 MG PO CAPS: 12.5000 mg | ORAL_CAPSULE | Freq: Every day | ORAL | 1 refills | Status: DC

## 2022-05-02 MED ORDER — AMLODIPINE BESYLATE 5 MG PO TABS: 5.0000 mg | ORAL_TABLET | Freq: Every day | ORAL | 1 refills | Status: DC

## 2022-05-02 NOTE — Patient Instructions (Addendum)
It was great seeing you today!  Plan discussed at today's visit: -Take Lisinopril 40 mg and HCTZ 12.5 mg after you wake up and Amlodipine 5 mg before you go to sleep -Continue to monitor blood pressure at home   Follow up in: 1 month   Take care and let us know if you have any questions or concerns prior to your next visit.  Dr. Rosana Berger

## 2022-06-02 ENCOUNTER — Encounter: Payer: Self-pay | Admitting: Nurse Practitioner

## 2022-06-02 ENCOUNTER — Ambulatory Visit: Payer: Managed Care, Other (non HMO) | Admitting: Family Medicine

## 2022-06-02 ENCOUNTER — Ambulatory Visit (INDEPENDENT_AMBULATORY_CARE_PROVIDER_SITE_OTHER): Payer: Managed Care, Other (non HMO) | Admitting: Nurse Practitioner

## 2022-06-02 ENCOUNTER — Other Ambulatory Visit: Payer: Self-pay

## 2022-06-02 VITALS — BP 136/82 | HR 93 | Temp 98.7°F | Resp 18 | Ht 68.0 in | Wt 240.9 lb

## 2022-06-02 DIAGNOSIS — I10 Essential (primary) hypertension: Secondary | ICD-10-CM

## 2022-06-02 NOTE — Progress Notes (Signed)
BP 136/82   Pulse 93   Temp 98.7 F (37.1 C) (Oral)   Resp 18   Ht _0  (1.727 m)   Wt 240 lb 14.4 oz (109.3 kg)   SpO2 97%   BMI 36.63 kg/m    Subjective:    Patient ID: Jason Leonard, male    DOB: [birth year], 51 y.o.   MRN: [id number]  HPI: Jason Leonard is a 51 y.o. male  Chief Complaint  Patient presents with   Hypertension   Hyperlipidemia    Follow up  HTN follow up:  last seen on 05/02/2022 by Dr. Rosana Berger.  His blood pressure was 155/100.  He had not been taking his blood pressure medication. He had ran out.  His prescription was refilled and he reports he has been taking his medication daily.  Meds: amlodipine 5 mg daily, hydrochlorothiazide 12.5 mg daily and lisinopril 40 mg daily.  He says that he often forgets to take his amlodipine because he is supposed to take that one at night. Discussed setting an alarm on his phone to help remind hi to take his medication. Patient reports his blood pressure has been running 150s/90s.  He says it is much better than it was he was getting readings in the 180s/100s. Today his blood pressure is 142/84, recheck was 136/82. Marland Kitchen He denies any chest pain, shortness of breath, headaches or blurred vision. He says he is going to work on increasing physical activity. He has been watching his sodium intake. Will get labs at next appointment. Discussed follow up in three months unless his blood pressure increases then he needs to seek earlier appointment.    Relevant past medical, surgical, family and social history reviewed and updated as indicated. Interim medical history since our last visit reviewed. Allergies and medications reviewed and updated.  Review of Systems  Constitutional: Negative for fever or weight change.  Respiratory: Negative for cough and shortness of breath.   Cardiovascular: Negative for chest pain or palpitations.  Gastrointestinal: Negative for abdominal pain, no bowel changes.  Musculoskeletal: Negative for gait problem  or joint swelling.  Skin: Negative for rash.  Neurological: Negative for dizziness or headache.  No other specific complaints in a complete review of systems (except as listed in HPI above).      Objective:    BP 136/82   Pulse 93   Temp 98.7 F (37.1 C) (Oral)   Resp 18   Ht _1  (1.727 m)   Wt 240 lb 14.4 oz (109.3 kg)   SpO2 97%   BMI 36.63 kg/m   Wt Readings from Last 3 Encounters:  06/02/22 240 lb 14.4 oz (109.3 kg)  05/02/22 238 lb 6.4 oz (108.1 kg)  03/16/22 235 lb 11.2 oz (106.9 kg)    Physical Exam  Constitutional: Patient appears well-developed and well-nourished. Obese  No distress.  HEENT: head atraumatic, normocephalic, pupils equal and reactive to light, neck supple Cardiovascular: Normal rate, regular rhythm and normal heart sounds.  No murmur heard. No BLE edema. Pulmonary/Chest: Effort normal and breath sounds normal. No respiratory distress. Abdominal: Soft.  There is no tenderness. Psychiatric: Patient has a normal mood and affect. behavior is normal. Judgment and thought content normal.   Results for orders placed or performed in visit on 03/16/22  CBC w/Diff/Platelet  Result Value Ref Range   WBC 3.9 3.8 - 10.8 Thousand/uL   RBC 5.19 4.20 - 5.80 Million/uL   Hemoglobin 14.7 13.2 - 17.1 g/dL   HCT 44.8  38.5 - 50.0 %   MCV 86.3 80.0 - 100.0 fL   MCH 28.3 27.0 - 33.0 pg   MCHC 32.8 32.0 - 36.0 g/dL   RDW 12.9 11.0 - 15.0 %   Platelets 133 (L) 140 - 400 Thousand/uL   MPV 12.7 (H) 7.5 - 12.5 fL   Neutro Abs 1,435 (L) 1,500 - 7,800 cells/uL   Lymphs Abs 2,153 850 - 3,900 cells/uL   Absolute Monocytes 273 200 - 950 cells/uL   Eosinophils Absolute 20 15 - 500 cells/uL   Basophils Absolute 20 0 - 200 cells/uL   Neutrophils Relative % 36.8 %   Total Lymphocyte 55.2 %   Monocytes Relative 7.0 %   Eosinophils Relative 0.5 %   Basophils Relative 0.5 %  COMPLETE METABOLIC PANEL WITH GFR  Result Value Ref Range   Glucose, Bld 87 65 - 99 mg/dL   BUN 21  7 - 25 mg/dL   Creat 1.35 (H) 0.70 - 1.30 mg/dL   eGFR 64 > OR = 60 mL/min/1.4m   BUN/Creatinine Ratio 16 6 - 22 (calc)   Sodium 141 135 - 146 mmol/L   Potassium 3.9 3.5 - 5.3 mmol/L   Chloride 104 98 - 110 mmol/L   CO2 30 20 - 32 mmol/L   Calcium 9.4 8.6 - 10.3 mg/dL   Total Protein 7.4 6.1 - 8.1 g/dL   Albumin 4.4 3.6 - 5.1 g/dL   Globulin 3.0 1.9 - 3.7 g/dL (calc)   AG Ratio 1.5 1.0 - 2.5 (calc)   Total Bilirubin 0.8 0.2 - 1.2 mg/dL   Alkaline phosphatase (APISO) 37 35 - 144 U/L   AST 36 (H) 10 - 35 U/L   ALT 28 9 - 46 U/L  Lipid Profile  Result Value Ref Range   Cholesterol 167 <200 mg/dL   HDL 61 > OR = 40 mg/dL   Triglycerides 67 <150 mg/dL   LDL Cholesterol (Calc) 91 mg/dL (calc)   Total CHOL/HDL Ratio 2.7 <5.0 (calc)   Non-HDL Cholesterol (Calc) 106 <130 mg/dL (calc)      Assessment & Plan:   Problem List Items Addressed This Visit       Cardiovascular and Mediastinum   Hypertension - Primary    Blood pressure has improved.  He  says he has not been consistently taking the amlodipine. Recommend setting an alarm to remind him to take it.  Continue lisinopril 40 mg daily, hydrochlorothiazide 12.5 mg daily and amlodipine 5 mg at bedtime. If no improvement with blood pressure after taking amlodipine consistently will need to adjust medications.        Follow up plan: Return in about 3 months (around 09/01/2022) for follow up, Leisa PA.

## 2022-06-02 NOTE — Assessment & Plan Note (Signed)
Blood pressure has improved.  He  says he has not been consistently taking the amlodipine. Recommend setting an alarm to remind him to take it.  Continue lisinopril 40 mg daily, hydrochlorothiazide 12.5 mg daily and amlodipine 5 mg at bedtime. If no improvement with blood pressure after taking amlodipine consistently will need to adjust medications.

## 2022-09-01 ENCOUNTER — Encounter: Payer: Self-pay | Admitting: Family Medicine

## 2022-09-01 ENCOUNTER — Ambulatory Visit (INDEPENDENT_AMBULATORY_CARE_PROVIDER_SITE_OTHER): Payer: No Typology Code available for payment source | Admitting: Family Medicine

## 2022-09-01 VITALS — BP 168/104 | HR 81 | Temp 97.5°F | Resp 16 | Ht 68.0 in | Wt 232.7 lb

## 2022-09-01 DIAGNOSIS — E669 Obesity, unspecified: Secondary | ICD-10-CM | POA: Diagnosis not present

## 2022-09-01 DIAGNOSIS — E782 Mixed hyperlipidemia: Secondary | ICD-10-CM

## 2022-09-01 DIAGNOSIS — I10 Essential (primary) hypertension: Secondary | ICD-10-CM | POA: Diagnosis not present

## 2022-09-01 DIAGNOSIS — Z6835 Body mass index (BMI) 35.0-35.9, adult: Secondary | ICD-10-CM | POA: Diagnosis not present

## 2022-09-01 NOTE — Patient Instructions (Signed)
Try restarting amlodipine but take a half dose - 2.5 mg at bedtime and see if you have any side effects with that lower dose  Continue you HCTZ 12.5 and lisinopril 40 daily in the morning - and your other healthy habits  Send me a message if you have return of intolerable side effects  Follow up in office in one month with your home BP cuff so we can see if it's accurate  DASH Eating Plan DASH stands for Dietary Approaches to Stop Hypertension. The DASH eating plan is a healthy eating plan that has been shown to: Reduce high blood pressure (hypertension). Reduce your risk for type 2 diabetes, heart disease, and stroke. Help with weight loss. What are tips for following this plan? Reading food labels Check food labels for the amount of salt (sodium) per serving. Choose foods with less than 5 percent of the Daily Value of sodium. Generally, foods with less than 300 milligrams (mg) of sodium per serving fit into this eating plan. To find whole grains, look for the word "whole" as the first word in the ingredient list. Shopping Buy products labeled as "low-sodium" or "no salt added." Buy fresh foods. Avoid canned foods and pre-made or frozen meals. Cooking Avoid adding salt when cooking. Use salt-free seasonings or herbs instead of table salt or sea salt. Check with your health care provider or pharmacist before using salt substitutes. Do not fry foods. Cook foods using healthy methods such as baking, boiling, grilling, roasting, and broiling instead. Cook with heart-healthy oils, such as olive, canola, avocado, soybean, or sunflower oil. Meal planning  Eat a balanced diet that includes: 4 or more servings of fruits and 4 or more servings of vegetables each day. Try to fill one-half of your plate with fruits and vegetables. 6-8 servings of whole grains each day. Less than 6 oz (170 g) of lean meat, poultry, or fish each day. A 3-oz (85-g) serving of meat is about the same size as a deck of  cards. One egg equals 1 oz (28 g). 2-3 servings of low-fat dairy each day. One serving is 1 cup (237 mL). 1 serving of nuts, seeds, or beans 5 times each week. 2-3 servings of heart-healthy fats. Healthy fats called omega-3 fatty acids are found in foods such as walnuts, flaxseeds, fortified milks, and eggs. These fats are also found in cold-water fish, such as sardines, salmon, and mackerel. Limit how much you eat of: Canned or prepackaged foods. Food that is high in trans fat, such as some fried foods. Food that is high in saturated fat, such as fatty meat. Desserts and other sweets, sugary drinks, and other foods with added sugar. Full-fat dairy products. Do not salt foods before eating. Do not eat more than 4 egg yolks a week. Try to eat at least 2 vegetarian meals a week. Eat more home-cooked food and less restaurant, buffet, and fast food. Lifestyle When eating at a restaurant, ask that your food be prepared with less salt or no salt, if possible. If you drink alcohol: Limit how much you use to: 0-1 drink a day for women who are not pregnant. 0-2 drinks a day for men. Be aware of how much alcohol is in your drink. In the [place] one drink equals one 12 oz bottle of beer (355 mL), one 5 oz glass of wine (148 mL), or one 1 oz glass of hard liquor (44 mL). General information Avoid eating more than 2,300 mg of salt a day. If  you have hypertension, you may need to reduce your sodium intake to 1,500 mg a day. Work with your health care provider to maintain a healthy body weight or to lose weight. Ask what an ideal weight is for you. Get at least 30 minutes of exercise that causes your heart to beat faster (aerobic exercise) most days of the week. Activities may include walking, swimming, or biking. Work with your health care provider or dietitian to adjust your eating plan to your individual calorie needs. What foods should I eat? Fruits All fresh, dried, or frozen fruit. Canned fruit in  natural juice (without added sugar). Vegetables Fresh or frozen vegetables (raw, steamed, roasted, or grilled). Low-sodium or reduced-sodium tomato and vegetable juice. Low-sodium or reduced-sodium tomato sauce and tomato paste. Low-sodium or reduced-sodium canned vegetables. Grains Whole-grain or whole-wheat bread. Whole-grain or whole-wheat pasta. Brown rice. [name]. Bulgur. Whole-grain and low-sodium cereals. Pita bread. Low-fat, low-sodium crackers. Whole-wheat flour tortillas. Meats and other proteins Skinless chicken or [place]. Ground chicken or [place]. Pork with fat trimmed off. Fish and seafood. Egg whites. Dried beans, peas, or lentils. Unsalted nuts, nut butters, and seeds. Unsalted canned beans. Lean cuts of beef with fat trimmed off. Low-sodium, lean precooked or cured meat, such as sausages or meat loaves. Dairy Low-fat (1%) or fat-free (skim) milk. Reduced-fat, low-fat, or fat-free cheeses. Nonfat, low-sodium ricotta or cottage cheese. Low-fat or nonfat yogurt. Low-fat, low-sodium cheese. Fats and oils Soft margarine without trans fats. Vegetable oil. Reduced-fat, low-fat, or light mayonnaise and salad dressings (reduced-sodium). Canola, safflower, olive, avocado, soybean, and sunflower oils. Avocado. Seasonings and condiments Herbs. Spices. Seasoning mixes without salt. Other foods Unsalted popcorn and pretzels. Fat-free sweets. The items listed above may not be a complete list of foods and beverages you can eat. Contact a dietitian for more information. What foods should I avoid? Fruits Canned fruit in a light or heavy syrup. Fried fruit. Fruit in cream or butter sauce. Vegetables Creamed or fried vegetables. Vegetables in a cheese sauce. Regular canned vegetables (not low-sodium or reduced-sodium). Regular canned tomato sauce and paste (not low-sodium or reduced-sodium). Regular tomato and vegetable juice (not low-sodium or reduced-sodium). Angie Fava.  Olives. Grains Baked goods made with fat, such as croissants, muffins, or some breads. Dry pasta or rice meal packs. Meats and other proteins Fatty cuts of meat. Ribs. Fried meat. Berniece Salines. Bologna, salami, and other precooked or cured meats, such as sausages or meat loaves. Fat from the back of a pig (fatback). Bratwurst. Salted nuts and seeds. Canned beans with added salt. Canned or smoked fish. Whole eggs or egg yolks. Chicken or [place] with skin. Dairy Whole or 2% milk, cream, and half-and-half. Whole or full-fat cream cheese. Whole-fat or sweetened yogurt. Full-fat cheese. Nondairy creamers. Whipped toppings. Processed cheese and cheese spreads. Fats and oils Butter. Stick margarine. Lard. Shortening. Ghee. Bacon fat. Tropical oils, such as coconut, palm kernel, or palm oil. Seasonings and condiments Onion salt, garlic salt, seasoned salt, table salt, and sea salt. Worcestershire sauce. Tartar sauce. Barbecue sauce. Teriyaki sauce. Soy sauce, including reduced-sodium. Steak sauce. Canned and packaged gravies. Fish sauce. Oyster sauce. Cocktail sauce. Store-bought horseradish. Ketchup. Mustard. Meat flavorings and tenderizers. Bouillon cubes. Hot sauces. Pre-made or packaged marinades. Pre-made or packaged taco seasonings. Relishes. Regular salad dressings. Other foods Salted popcorn and pretzels. The items listed above may not be a complete list of foods and beverages you should avoid. Contact a dietitian for more information. Where to find more information National Heart, Lung, and  Blood Institute: https://wilson-eaton.com/ American Heart Association: www.heart.org Academy of Nutrition and Dietetics: www.eatright.[place]: www.kidney.org Summary The DASH eating plan is a healthy eating plan that has been shown to reduce high blood pressure (hypertension). It may also reduce your risk for type 2 diabetes, heart disease, and stroke. When on the DASH eating plan, aim to eat more  fresh fruits and vegetables, whole grains, lean proteins, low-fat dairy, and heart-healthy fats. With the DASH eating plan, you should limit salt (sodium) intake to 2,300 mg a day. If you have hypertension, you may need to reduce your sodium intake to 1,500 mg a day. Work with your health care provider or dietitian to adjust your eating plan to your individual calorie needs. This information is not intended to replace advice given to you by your health care provider. Make sure you discuss any questions you have with your health care provider. Document Revised: 05/24/2019 Document Reviewed: 05/24/2019 Elsevier Patient Education  [place].

## 2022-09-01 NOTE — Assessment & Plan Note (Signed)
Blood pressure again is uncontrolled, he was not able to tolerate amlodipine 5 mg Last office visit blood pressure was fairly well-controlled and he did previously tolerate amlodipine 2.5 mg and he has had good med compliance with lisinopril 40 and hydrochlorothiazide 12.5 He has excellent diet and lifestyle -there may be little room for improvement there He reports his parents both have high blood pressure but he does not know of any kidney disease and family  BP Readings from Last 5 Encounters:  09/01/22 (!) 168/104  06/02/22 136/82  05/02/22 (!) 155/100  03/16/22 (!) 144/92  09/08/21 (!) 142/85   His blood pressure about 3 months ago was better with lisinopril 40 mg, hydrochlorothiazide 12.5 and amlodipine 2.5 so we will return to this and see if he tolerates it I did ask him to send me a message if he has again medication side effects that are intolerable (may want to try increasing hydrochlorothiazide?  If down the road blood pressure remains uncontrolled may want to try a ARB in place of ACEI?  Also consider ruling out sleep apnea ordering renal Doppler, or additional lab work)  Patient will try to resume amlodipine medication and follow-up in clinic with his blood pressure cuff in about 1 month for recheck  Thankfully he is exercising vigorously, has no exertional symptoms, no signs or symptoms concerning for endorgan damage

## 2022-09-01 NOTE — Progress Notes (Signed)
Name: Jason Leonard   MRN: [id number]    DOB: [birth year]   Date:09/01/2022       Progress Note  Chief Complaint  Patient presents with   Follow-up   Hypertension   Hyperlipidemia     Subjective:   Jason Leonard is a 52 y.o. male, presents to clinic for routine f/up  BP has not been well controlled - he saw Dr. Rosana Berger and Almyra Free over the past couple months Currently on lisinopril 40, HCTZ 12.5 daily in am and amlodipine 5 mg QHS He states that for the past couple months he has not been taking amlodipine 5 mg he found that it caused him to be extremely tired He has been on amlodipine in the past and he does not recall severe fatigue or sleepiness and in September he was given 2.5 mg amlodipine without any side effects at that time either  Blood pressure readings are elevated today despite multiple rechecks BP Readings from Last 10 Encounters:  09/01/22 (!) 168/104  06/02/22 136/82  05/02/22 (!) 155/100  03/16/22 (!) 144/92  09/08/21 (!) 142/85  09/02/21 (!) 167/104  08/11/21 (!) 155/90  07/21/21 (!) 160/98  06/09/21 (!) 155/100  12/17/20 132/84  He has not monitoring blood pressure at home He continues to have a very active and healthy diet and lifestyle only drinks water, exercises regularly He denies any chest pain, chest pressure, palpitations, dyspnea on exertion, orthopnea, PND, near-syncope, lower extremity edema   HLD on simvastatin -good compliance Last labs were 03/2022:  well controlled on current med and dose Lab Results  Component Value Date   CHOL 167 03/22/2022   HDL 61 03/22/2022   LDLCALC 91 03/22/2022   TRIG 67 03/22/2022   CHOLHDL 2.7 03/22/2022     Current Outpatient Medications:    amLODipine (NORVASC) 5 MG tablet, Take 1 tablet (5 mg total) by mouth daily., Disp: 90 tablet, Rfl: 1   hydrochlorothiazide (MICROZIDE) 12.5 MG capsule, Take 1 capsule (12.5 mg total) by mouth daily., Disp: 90 capsule, Rfl: 1   lisinopril (ZESTRIL) 40 MG tablet, Take 1  tablet (40 mg total) by mouth daily., Disp: 90 tablet, Rfl: 1   simvastatin (ZOCOR) 20 MG tablet, Take 1 tablet (20 mg total) by mouth at bedtime., Disp: 90 tablet, Rfl: 1  Patient Active Problem List   Diagnosis Date Noted   Obesity (BMI 30-39.9) 12/17/2020   Hypertension 06/14/2018   Hyperlipidemia 06/14/2018    Past Surgical History:  Procedure Laterality Date   ARTHROSCOPIC REPAIR ACL     COLONOSCOPY WITH PROPOFOL N/A 01/09/2020   Procedure: COLONOSCOPY WITH PROPOFOL;  Surgeon: Jonathon Bellows, MD;  Location: [facility] ENDOSCOPY;  Service: Gastroenterology;  Laterality: N/A;   WISDOM TOOTH EXTRACTION      Family History  Problem Relation Age of Onset   Hypertension Mother    Hypertension Father    Lupus Sister    Diabetes Maternal Grandfather    Cancer Maternal Grandfather    Cancer Paternal Grandmother     Social History   Tobacco Use   Smoking status: Never   Smokeless tobacco: Never  Vaping Use   Vaping Use: Never used  Substance Use Topics   Alcohol use: No   Drug use: No     No Known Allergies  Health Maintenance  Topic Date Due   Zoster Vaccines- Shingrix (1 of 2) Never done   COVID-19 Vaccine (2 - 2023-24 season) 03/04/2022   INFLUENZA VACCINE  10/02/2022 (Originally 02/01/2022)   COLONOSCOPY (  Pts 45-70yr Insurance coverage will need to be confirmed)  01/08/2030   DTaP/Tdap/Td (2 - Td or Tdap) 03/16/2032   HIV Screening  Completed   HPV VACCINES  Aged Out    Chart Review Today: I personally reviewed active problem list, medication list, allergies, family history, social history, health maintenance, notes from last encounter, lab results, imaging with the patient/caregiver today.   Review of Systems  Constitutional: Negative.   HENT: Negative.    Eyes: Negative.   Respiratory: Negative.    Cardiovascular: Negative.   Gastrointestinal: Negative.   Endocrine: Negative.   Genitourinary: Negative.   Musculoskeletal: Negative.   Skin: Negative.    Allergic/Immunologic: Negative.   Neurological: Negative.   Hematological: Negative.   Psychiatric/Behavioral: Negative.    All other systems reviewed and are negative.    Objective:   Vitals:   09/01/22 1337 09/01/22 1356 09/01/22 1403  BP: (!) 182/108 (!) 170/108 (!) 168/104  Pulse: 81    Resp: 16    Temp: (!) 97.5 F (36.4 C)    TempSrc: Oral    SpO2: 97%    Weight: 232 lb 11.2 oz (105.6 kg)    Height: '5\' 8"'$  (1.727 m)      Body mass index is 35.38 kg/m.  Physical Exam Vitals and nursing note reviewed.  Constitutional:      General: He is not in acute distress.    Appearance: Normal appearance. He is well-developed and well-groomed. He is not ill-appearing, toxic-appearing or diaphoretic.     Comments: Muscular build, well proportioned  HENT:     Head: Normocephalic and atraumatic.     Jaw: No trismus.     Right Ear: External ear normal.     Left Ear: External ear normal.  Eyes:     General: Lids are normal. No scleral icterus.       Right eye: No discharge.        Left eye: No discharge.     Conjunctiva/sclera: Conjunctivae normal.  Neck:     Trachea: Trachea and phonation normal. No tracheal deviation.  Cardiovascular:     Rate and Rhythm: Normal rate and regular rhythm.     Pulses: Normal pulses.          Radial pulses are 2+ on the right side and 2+ on the left side.       Posterior tibial pulses are 2+ on the right side and 2+ on the left side.     Heart sounds: Normal heart sounds. No murmur heard.    No friction rub. No gallop.  Pulmonary:     Effort: Pulmonary effort is normal. No respiratory distress.     Breath sounds: Normal breath sounds. No stridor. No wheezing, rhonchi or rales.  Abdominal:     General: Bowel sounds are normal. There is no distension.     Palpations: Abdomen is soft.  Musculoskeletal:     Right lower leg: No edema.     Left lower leg: No edema.  Skin:    General: Skin is warm and dry.     Coloration: Skin is not  jaundiced.     Findings: No rash.     Nails: There is no clubbing.  Neurological:     Mental Status: He is alert. Mental status is at baseline.     Cranial Nerves: No dysarthria or facial asymmetry.     Motor: No tremor or abnormal muscle tone.     Gait: Gait normal.  Psychiatric:  Mood and Affect: Mood normal.        Speech: Speech normal.        Behavior: Behavior normal. Behavior is cooperative.         Assessment & Plan:   Problem List Items Addressed This Visit       Cardiovascular and Mediastinum   Hypertension - Primary    Blood pressure again is uncontrolled, he was not able to tolerate amlodipine 5 mg Last office visit blood pressure was fairly well-controlled and he did previously tolerate amlodipine 2.5 mg and he has had good med compliance with lisinopril 40 and hydrochlorothiazide 12.5 He has excellent diet and lifestyle -there may be little room for improvement there He reports his parents both have high blood pressure but he does not know of any kidney disease and family  BP Readings from Last 5 Encounters:  09/01/22 (!) 168/104  06/02/22 136/82  05/02/22 (!) 155/100  03/16/22 (!) 144/92  09/08/21 (!) 142/85  His blood pressure about 3 months ago was better with lisinopril 40 mg, hydrochlorothiazide 12.5 and amlodipine 2.5 so we will return to this and see if he tolerates it I did ask him to send me a message if he has again medication side effects that are intolerable (may want to try increasing hydrochlorothiazide?  If down the road blood pressure remains uncontrolled may want to try a ARB in place of ACEI?  Also consider ruling out sleep apnea ordering renal Doppler, or additional lab work)  Patient will try to resume amlodipine medication and follow-up in clinic with his blood pressure cuff in about 1 month for recheck  Thankfully he is exercising vigorously, has no exertional symptoms, no signs or symptoms concerning for endorgan damage        Relevant Orders   COMPLETE METABOLIC PANEL WITH GFR     Other   Hyperlipidemia   Relevant Orders   COMPLETE METABOLIC PANEL WITH GFR   Other Visit Diagnoses     Uncontrolled hypertension       Checking protein in urine may want to ultrasound kidneys, do sleep study or other eval if unable to get BP controlled   Relevant Orders   COMPLETE METABOLIC PANEL WITH GFR   Microalbumin / creatinine urine ratio   Class 2 obesity with body mass index (BMI) of 35.0 to 35.9 in adult, unspecified obesity type, unspecified whether serious comorbidity present       BMI high due to large muscle mass, does not correlate with abdominal obesity and likely overestimates disease burden for this patient        Return for 1 month in office BP recheck .   Delsa Grana, PA-C 09/01/22 1:57 PM

## 2022-09-01 NOTE — Assessment & Plan Note (Signed)
Last lipid panel was done in September, reviewed today, LDL was fairly well-controlled on simvastatin 20 mg dose which she has been on for a few years.  Good compliance without side effects or concerns  Lab Results  Component Value Date   CHOL 167 03/22/2022   HDL 61 03/22/2022   LDLCALC 91 03/22/2022   TRIG 67 03/22/2022   CHOLHDL 2.7 03/22/2022

## 2022-09-02 LAB — COMPLETE METABOLIC PANEL WITH GFR
AG Ratio: 1.5 (calc) (ref 1.0–2.5)
ALT: 19 U/L (ref 9–46)
AST: 34 U/L (ref 10–35)
Albumin: 4.4 g/dL (ref 3.6–5.1)
Alkaline phosphatase (APISO): 41 U/L (ref 35–144)
BUN/Creatinine Ratio: 17 (calc) (ref 6–22)
BUN: 24 mg/dL (ref 7–25)
CO2: 27 mmol/L (ref 20–32)
Calcium: 9.5 mg/dL (ref 8.6–10.3)
Chloride: 107 mmol/L (ref 98–110)
Creat: 1.38 mg/dL — ABNORMAL HIGH (ref 0.70–1.30)
Globulin: 3 g/dL (calc) (ref 1.9–3.7)
Glucose, Bld: 78 mg/dL (ref 65–99)
Potassium: 4 mmol/L (ref 3.5–5.3)
Sodium: 143 mmol/L (ref 135–146)
Total Bilirubin: 0.5 mg/dL (ref 0.2–1.2)
Total Protein: 7.4 g/dL (ref 6.1–8.1)
eGFR: 62 mL/min/{1.73_m2} (ref >=60)

## 2022-09-02 LAB — MICROALBUMIN / CREATININE URINE RATIO
Creatinine, Urine: 125 mg/dL (ref 20–320)
Microalb Creat Ratio: 22 mcg/mg creat (ref <30)
Microalb, Ur: 2.8 mg/dL

## 2022-09-15 ENCOUNTER — Other Ambulatory Visit: Payer: Self-pay | Admitting: Internal Medicine

## 2022-09-15 DIAGNOSIS — E782 Mixed hyperlipidemia: Secondary | ICD-10-CM

## 2022-09-15 NOTE — Telephone Encounter (Signed)
Requested Prescriptions  Pending Prescriptions Disp Refills   simvastatin (ZOCOR) 20 MG tablet [Pharmacy Med Name: Simvastatin 20 MG Oral Tablet] 90 tablet 0    Sig: TAKE 1 TABLET BY MOUTH AT BEDTIME     Cardiovascular:  Antilipid - Statins Failed - 09/15/2022  6:30 AM      Failed - Lipid Panel in normal range within the last 12 months    Cholesterol  Date Value Ref Range Status  03/22/2022 167 <200 mg/dL Final   LDL Cholesterol (Calc)  Date Value Ref Range Status  03/22/2022 91 mg/dL (calc) Final    Comment:    Reference range: <100 . Desirable range <100 mg/dL for primary prevention;   <70 mg/dL for patients with CHD or diabetic patients  with > or = 2 CHD risk factors. Marland Kitchen LDL-C is now calculated using the Martin-Hopkins  calculation, which is a validated novel method providing  better accuracy than the Friedewald equation in the  estimation of LDL-C.  Cresenciano Genre et al. Annamaria Helling. [id number]): [id number]  (http://education.[facility].com/faq/FAQ164)    HDL  Date Value Ref Range Status  03/22/2022 61 > OR = 40 mg/dL Final   Triglycerides  Date Value Ref Range Status  03/22/2022 67 <150 mg/dL Final         Passed - Patient is not pregnant      Passed - Valid encounter within last 12 months    Recent Outpatient Visits           2 weeks ago Hypertension, unspecified type   [facility] Delsa Grana, PA-C   3 months ago Hypertension, unspecified type   [facility] Bo Merino, FNP   4 months ago Hypertension, unspecified type   [facility] Teodora Medici, DO   6 months ago Hypertension, unspecified type   [facility] Teodora Medici, DO   1 year ago Hypertension, unspecified type   [facility] Teodora Medici, DO       Future Appointments             In 2 weeks Delsa Grana, [place], [facility]

## 2022-09-29 ENCOUNTER — Encounter: Payer: Self-pay | Admitting: Family Medicine

## 2022-09-29 ENCOUNTER — Ambulatory Visit (INDEPENDENT_AMBULATORY_CARE_PROVIDER_SITE_OTHER): Payer: No Typology Code available for payment source | Admitting: Family Medicine

## 2022-09-29 VITALS — BP 138/94 | HR 75 | Temp 97.8°F | Resp 16 | Ht 68.0 in | Wt 229.0 lb

## 2022-09-29 DIAGNOSIS — I10 Essential (primary) hypertension: Secondary | ICD-10-CM | POA: Diagnosis not present

## 2022-09-29 MED ORDER — AMLODIPINE BESYLATE 5 MG PO TABS: 5.0000 mg | ORAL_TABLET | Freq: Two times a day (BID) | ORAL | 0 refills | Status: DC

## 2022-09-29 NOTE — Progress Notes (Signed)
Name: Jason Leonard   MRN: [id number]    DOB: 09-Dec-1970   Date:09/29/2022       Progress Note  Chief Complaint  Patient presents with   Follow-up   Hypertension    Pt states at home it has been running above 140s/90s and he skipped last night his amlodipine dose     Subjective:   Jason Leonard is a 52 y.o. male, presents to clinic for f/up with HTN uncontrolled  Here for recheck after med changes and continued diet/lifestyle efforts On lisinopril 40 mg, HCTZ 12.5 and norvasc once daily  5 mg - at first had SE with meds but they seemed to improve/resolve after taking it for a while BP still above goal of 130/80, pt continues to be asx BP Readings from Last 3 Encounters:  09/29/22 (!) 138/94  09/01/22 (!) 168/104  06/02/22 136/82       Current Outpatient Medications:    amLODipine (NORVASC) 5 MG tablet, Take 1 tablet (5 mg total) by mouth daily., Disp: 90 tablet, Rfl: 1   hydrochlorothiazide (MICROZIDE) 12.5 MG capsule, Take 1 capsule (12.5 mg total) by mouth daily., Disp: 90 capsule, Rfl: 1   lisinopril (ZESTRIL) 40 MG tablet, Take 1 tablet (40 mg total) by mouth daily., Disp: 90 tablet, Rfl: 1   simvastatin (ZOCOR) 20 MG tablet, TAKE 1 TABLET BY MOUTH AT BEDTIME, Disp: 90 tablet, Rfl: 0  Patient Active Problem List   Diagnosis Date Noted   Obesity (BMI 30-39.9) 12/17/2020   Hypertension 06/14/2018   Hyperlipidemia 06/14/2018    Past Surgical History:  Procedure Laterality Date   ARTHROSCOPIC REPAIR ACL     COLONOSCOPY WITH PROPOFOL N/A 01/09/2020   Procedure: COLONOSCOPY WITH PROPOFOL;  Surgeon: Wyline Mood, MD;  Location: [facility] ENDOSCOPY;  Service: Gastroenterology;  Laterality: N/A;   WISDOM TOOTH EXTRACTION      Family History  Problem Relation Age of Onset   Hypertension Mother    Hypertension Father    Lupus Sister    Diabetes Maternal Grandfather    Cancer Maternal Grandfather    Cancer Paternal Grandmother     Social History   Tobacco Use   Smoking  status: Never   Smokeless tobacco: Never  Vaping Use   Vaping Use: Never used  Substance Use Topics   Alcohol use: No   Drug use: No     No Known Allergies  Health Maintenance  Topic Date Due   INFLUENZA VACCINE  10/02/2022 (Originally 02/01/2022)   COVID-19 Vaccine (2 - 2023-24 season) 10/15/2022 (Originally 03/04/2022)   Zoster Vaccines- Shingrix (1 of 2) 12/30/2022 (Originally 09/20/2020)   COLONOSCOPY (Pts 45-48yrs Insurance coverage will need to be confirmed)  01/08/2030   DTaP/Tdap/Td (2 - Td or Tdap) 03/16/2032   HIV Screening  Completed   HPV VACCINES  Aged Out    Chart Review Today: I personally reviewed active problem list, medication list, allergies, family history, social history, health maintenance, notes from last encounter, lab results, imaging with the patient/caregiver today.   Review of Systems  Constitutional: Negative.   HENT: Negative.    Eyes: Negative.   Respiratory: Negative.    Cardiovascular: Negative.   Gastrointestinal: Negative.   Endocrine: Negative.   Genitourinary: Negative.   Musculoskeletal: Negative.   Skin: Negative.   Allergic/Immunologic: Negative.   Neurological: Negative.   Hematological: Negative.   Psychiatric/Behavioral: Negative.    All other systems reviewed and are negative.    Objective:   Vitals:   09/29/22 1410  09/29/22 1420  BP: (!) 138/94 (!) 138/94  Pulse: 75   Resp: 16   Temp: 97.8 F (36.6 C)   TempSrc: Oral   SpO2: 98%   Weight: 229 lb (103.9 kg)   Height: 5\' 8"  (1.727 m)     Body mass index is 34.82 kg/m.  Physical Exam Vitals and nursing note reviewed.  Constitutional:      General: He is not in acute distress.    Appearance: Normal appearance. He is well-developed and well-groomed. He is not ill-appearing, toxic-appearing or diaphoretic.     Comments: Muscular build, well proportioned  HENT:     Head: Normocephalic and atraumatic.     Jaw: No trismus.     Right Ear: External ear normal.      Left Ear: External ear normal.  Eyes:     General: Lids are normal. No scleral icterus.       Right eye: No discharge.        Left eye: No discharge.     Conjunctiva/sclera: Conjunctivae normal.  Neck:     Trachea: Trachea and phonation normal. No tracheal deviation.  Cardiovascular:     Rate and Rhythm: Normal rate and regular rhythm.     Pulses: Normal pulses.          Radial pulses are 2+ on the right side and 2+ on the left side.       Posterior tibial pulses are 2+ on the right side and 2+ on the left side.     Heart sounds: Normal heart sounds. No murmur heard.    No friction rub. No gallop.  Pulmonary:     Effort: Pulmonary effort is normal. No respiratory distress.     Breath sounds: Normal breath sounds. No stridor. No wheezing, rhonchi or rales.  Abdominal:     General: Bowel sounds are normal. There is no distension.     Palpations: Abdomen is soft.  Musculoskeletal:     Right lower leg: No edema.     Left lower leg: No edema.  Skin:    General: Skin is warm and dry.     Coloration: Skin is not jaundiced.     Findings: No rash.     Nails: There is no clubbing.  Neurological:     Mental Status: He is alert. Mental status is at baseline.     Cranial Nerves: No dysarthria or facial asymmetry.     Motor: No tremor or abnormal muscle tone.     Gait: Gait normal.  Psychiatric:        Mood and Affect: Mood normal.        Speech: Speech normal.        Behavior: Behavior normal. Behavior is cooperative.         Assessment & Plan:   Problem List Items Addressed This Visit       Cardiovascular and Mediastinum   Hypertension - Primary    Pt here for one month f/up BP recheck BP has improved quite a bit, but is still not yet at goal  Last OV A&P: Blood pressure again is uncontrolled, he was not able to tolerate amlodipine 5 mg Last office visit blood pressure was fairly well-controlled and he did previously tolerate amlodipine 2.5 mg and he has had good med  compliance with lisinopril 40 and hydrochlorothiazide 12.5 He has excellent diet and lifestyle -there may be little room for improvement there He reports his parents both have high blood pressure but he  does not know of any kidney disease and family      BP Readings from Last 5 Encounters:  09/01/22 (!) 168/104  06/02/22 136/82  05/02/22 (!) 155/100  03/16/22 (!) 144/92  09/08/21 (!) 142/85    His blood pressure about 3 months ago was better with lisinopril 40 mg, hydrochlorothiazide 12.5 and amlodipine 2.5 so we will return to this and see if he tolerates it I did ask him to send me a message if he has again medication side effects that are intolerable (may want to try increasing hydrochlorothiazide?  If down the road blood pressure remains uncontrolled may want to try a ARB in place of ACEI?  Also consider ruling out sleep apnea ordering renal Doppler, or additional lab work)   Patient will try to resume amlodipine medication and follow-up in clinic with his blood pressure cuff in about 1 month for recheck   Thankfully he is exercising vigorously, has no exertional symptoms, no signs or symptoms concerning for endorgan damage  Pt reports tolerating norvasc 5 mg after taking it for a while He will try taking norvasc 5 mg BID and and do another 1 month BP recheck HTN clinic referral after that BP Readings from Last 3 Encounters:  09/29/22 (!) 138/94  09/01/22 (!) 168/104  06/02/22 136/82        Relevant Medications   amLODipine (NORVASC) 5 MG tablet     Return for 1 month .   Danelle Berry, PA-C 09/29/22 2:23 PM

## 2022-09-30 ENCOUNTER — Ambulatory Visit: Payer: Managed Care, Other (non HMO) | Admitting: Family Medicine

## 2022-10-03 ENCOUNTER — Other Ambulatory Visit: Payer: Self-pay | Admitting: Family Medicine

## 2022-10-03 DIAGNOSIS — E782 Mixed hyperlipidemia: Secondary | ICD-10-CM

## 2022-10-03 NOTE — Telephone Encounter (Signed)
Medication Refill - Medication: simvastatin (ZOCOR) 20 MG tablet [id number]   Has the patient contacted their pharmacy? Yes.    (Agent: If yes, when and what did the pharmacy advise?) Contact Provider   Preferred Pharmacy (with phone number or street name): [place], [address]   Has the patient been seen for an appointment in the last year OR does the patient have an upcoming appointment? Yes.    Agent: Please be advised that RX refills may take up to 3 business days. We ask that you follow-up with your pharmacy.

## 2022-10-04 NOTE — Telephone Encounter (Signed)
Rx 09/15/22 #90- too soon Requested Prescriptions  Pending Prescriptions Disp Refills   simvastatin (ZOCOR) 20 MG tablet 90 tablet 0    Sig: Take 1 tablet (20 mg total) by mouth at bedtime.     Cardiovascular:  Antilipid - Statins Failed - 10/03/2022 12:53 PM      Failed - Lipid Panel in normal range within the last 12 months    Cholesterol  Date Value Ref Range Status  03/22/2022 167 <200 mg/dL Final   LDL Cholesterol (Calc)  Date Value Ref Range Status  03/22/2022 91 mg/dL (calc) Final    Comment:    Reference range: <100 . Desirable range <100 mg/dL for primary prevention;   <70 mg/dL for patients with CHD or diabetic patients  with > or = 2 CHD risk factors. Marland Kitchen LDL-C is now calculated using the Martin-Hopkins  calculation, which is a validated novel method providing  better accuracy than the Friedewald equation in the  estimation of LDL-C.  Cresenciano Genre et al. Annamaria Helling. [id number]): 2061-2068  (http://education.[facility].com/faq/FAQ164)    HDL  Date Value Ref Range Status  03/22/2022 61 > OR = 40 mg/dL Final   Triglycerides  Date Value Ref Range Status  03/22/2022 67 <150 mg/dL Final         Passed - Patient is not pregnant      Passed - Valid encounter within last 12 months    Recent Outpatient Visits           5 days ago Hypertension, unspecified type   [facility] Delsa Grana, PA-C   1 month ago Hypertension, unspecified type   [facility] Delsa Grana, PA-C   4 months ago Hypertension, unspecified type   [facility] Bo Merino, FNP   5 months ago Hypertension, unspecified type   [facility] Teodora Medici, DO   6 months ago Hypertension, unspecified type   [facility] Teodora Medici, DO       Future Appointments             In 3 weeks Delsa Grana, [facility], [facility]

## 2022-10-14 ENCOUNTER — Encounter: Payer: Self-pay | Admitting: Family Medicine

## 2022-10-14 NOTE — Assessment & Plan Note (Signed)
Pt here for one month f/up BP recheck BP has improved quite a bit, but is still not yet at goal  Last OV A&P: Blood pressure again is uncontrolled, he was not able to tolerate amlodipine 5 mg Last office visit blood pressure was fairly well-controlled and he did previously tolerate amlodipine 2.5 mg and he has had good med compliance with lisinopril 40 and hydrochlorothiazide 12.5 He has excellent diet and lifestyle -there may be little room for improvement there He reports his parents both have high blood pressure but he does not know of any kidney disease and family      BP Readings from Last 5 Encounters:  09/01/22 (!) 168/104  06/02/22 136/82  05/02/22 (!) 155/100  03/16/22 (!) 144/92  09/08/21 (!) 142/85    His blood pressure about 3 months ago was better with lisinopril 40 mg, hydrochlorothiazide 12.5 and amlodipine 2.5 so we will return to this and see if he tolerates it I did ask him to send me a message if he has again medication side effects that are intolerable (may want to try increasing hydrochlorothiazide?  If down the road blood pressure remains uncontrolled may want to try a ARB in place of ACEI?  Also consider ruling out sleep apnea ordering renal Doppler, or additional lab work)   Patient will try to resume amlodipine medication and follow-up in clinic with his blood pressure cuff in about 1 month for recheck   Thankfully he is exercising vigorously, has no exertional symptoms, no signs or symptoms concerning for endorgan damage  Pt reports tolerating norvasc 5 mg after taking it for a while He will try taking norvasc 5 mg BID and and do another 1 month BP recheck HTN clinic referral after that BP Readings from Last 3 Encounters:  09/29/22 (!) 138/94  09/01/22 (!) 168/104  06/02/22 136/82

## 2022-10-26 ENCOUNTER — Other Ambulatory Visit: Payer: Self-pay | Admitting: Internal Medicine

## 2022-10-26 DIAGNOSIS — I10 Essential (primary) hypertension: Secondary | ICD-10-CM

## 2022-10-26 NOTE — Telephone Encounter (Signed)
Requested Prescriptions  Pending Prescriptions Disp Refills   lisinopril (ZESTRIL) 40 MG tablet [Pharmacy Med Name: Lisinopril 40 MG Oral Tablet] 90 tablet 0    Sig: Take 1 tablet by mouth once daily     Cardiovascular:  ACE Inhibitors Failed - 10/26/2022  6:32 AM      Failed - Cr in normal range and within 180 days    Creat  Date Value Ref Range Status  09/01/2022 1.38 (H) 0.70 - 1.30 mg/dL Final   Creatinine, Urine  Date Value Ref Range Status  09/01/2022 125 20 - 320 mg/dL Final         Failed - Last BP in normal range    BP Readings from Last 1 Encounters:  09/29/22 (!) 138/94         Passed - K in normal range and within 180 days    Potassium  Date Value Ref Range Status  09/01/2022 4.0 3.5 - 5.3 mmol/L Final         Passed - Patient is not pregnant      Passed - Valid encounter within last 6 months    Recent Outpatient Visits           3 weeks ago Hypertension, unspecified type   [facility] Danelle Berry, PA-C   1 month ago Hypertension, unspecified type   [facility] Danelle Berry, PA-C   4 months ago Hypertension, unspecified type   [facility] Berniece Salines, FNP   5 months ago Hypertension, unspecified type   [facility] Margarita Mail, DO   7 months ago Hypertension, unspecified type   [facility] Margarita Mail, DO       Future Appointments             In 5 days Danelle Berry, PA-C [facility], [facility]

## 2022-10-31 ENCOUNTER — Ambulatory Visit (INDEPENDENT_AMBULATORY_CARE_PROVIDER_SITE_OTHER): Payer: Managed Care, Other (non HMO) | Admitting: Family Medicine

## 2022-10-31 ENCOUNTER — Encounter: Payer: Self-pay | Admitting: Family Medicine

## 2022-10-31 VITALS — BP 162/102 | HR 79 | Temp 97.6°F | Resp 16 | Ht 68.0 in | Wt 228.8 lb

## 2022-10-31 DIAGNOSIS — I10 Essential (primary) hypertension: Secondary | ICD-10-CM

## 2022-10-31 DIAGNOSIS — J329 Chronic sinusitis, unspecified: Secondary | ICD-10-CM | POA: Diagnosis not present

## 2022-10-31 MED ORDER — FLUTICASONE PROPIONATE 50 MCG/ACT NA SUSP
2.0000 | Freq: Every day | NASAL | 6 refills | Status: DC
Start: 2022-10-31 — End: 2023-11-07

## 2022-10-31 MED ORDER — LEVOCETIRIZINE DIHYDROCHLORIDE 5 MG PO TABS
5.0000 mg | ORAL_TABLET | Freq: Every day | ORAL | 2 refills | Status: DC
Start: 2022-10-31 — End: 2023-11-07

## 2022-10-31 MED ORDER — HYDROCHLOROTHIAZIDE 12.5 MG PO CAPS: 12.5000 mg | ORAL_CAPSULE | Freq: Every day | ORAL | 1 refills | Status: DC

## 2022-10-31 NOTE — Progress Notes (Signed)
Patient ID: Jason Leonard, male    DOB: [birth year], 52 y.o.   MRN: [id number]  PCP: Danelle Berry, PA-C  Chief Complaint  Patient presents with   Follow-up    HTN    Subjective:   Jason Leonard is a 52 y.o. male, presents to clinic with CC of the following:  HPI  Congested with allergies lately due to weather changes He took cold meds, has been congested and not feeling well, he hasn't taken his BP readings at home BP high today but he's asx Denies exertional cp, HA's, SOB, palpitations, LE edema  Meds changed to slowly increase amlodipine dose due to various med sensitivities and SE He increased bedtime norvasc from 5 to 7.5 He take lisinopril 40 and HCTZ 12.5 in the am He was going to hold meds to see if possible ED SE resolved, but taking meds he had that SE resolve - no longer an issue  BP high today BP Readings from Last 3 Encounters:  10/31/22 (!) 162/102  09/29/22 (!) 138/94  09/01/22 (!) 168/104        Patient Active Problem List   Diagnosis Date Noted   Obesity (BMI 30-39.9) 12/17/2020   Hypertension 06/14/2018   Hyperlipidemia 06/14/2018      Current Outpatient Medications:    amLODipine (NORVASC) 5 MG tablet, Take 1 tablet (5 mg total) by mouth 2 (two) times daily., Disp: 60 tablet, Rfl: 0   hydrochlorothiazide (MICROZIDE) 12.5 MG capsule, Take 1 capsule (12.5 mg total) by mouth daily., Disp: 90 capsule, Rfl: 1   lisinopril (ZESTRIL) 40 MG tablet, Take 1 tablet by mouth once daily, Disp: 90 tablet, Rfl: 0   simvastatin (ZOCOR) 20 MG tablet, TAKE 1 TABLET BY MOUTH AT BEDTIME, Disp: 90 tablet, Rfl: 0   No Known Allergies   Social History   Tobacco Use   Smoking status: Never   Smokeless tobacco: Never  Vaping Use   Vaping Use: Never used  Substance Use Topics   Alcohol use: No   Drug use: No      Chart Review Today: I personally reviewed active problem list, medication list, allergies, family history, social history, health maintenance,  notes from last encounter, lab results, imaging with the patient/caregiver today.   Review of Systems  Constitutional: Negative.   HENT: Negative.    Eyes: Negative.   Respiratory: Negative.    Cardiovascular: Negative.   Gastrointestinal: Negative.   Endocrine: Negative.   Genitourinary: Negative.   Musculoskeletal: Negative.   Skin: Negative.   Allergic/Immunologic: Negative.   Neurological: Negative.   Hematological: Negative.   Psychiatric/Behavioral: Negative.    All other systems reviewed and are negative.      Objective:   Vitals:   10/31/22 1413 10/31/22 1429  BP: (!) 160/110 (!) 162/102  Pulse: 79   Resp: 16   Temp: 97.6 F (36.4 C)   TempSrc: Oral   SpO2: 97%   Weight: 228 lb 12.8 oz (103.8 kg)   Height: 5\' 8"  (1.727 m)     Body mass index is 34.79 kg/m.  Physical Exam Vitals and nursing note reviewed.  Constitutional:      General: He is not in acute distress.    Appearance: Normal appearance. He is well-developed. He is not ill-appearing, toxic-appearing or diaphoretic.  HENT:     Head: Normocephalic and atraumatic.     Right Ear: Hearing, tympanic membrane, ear canal and external ear normal.     Left Ear: Hearing, tympanic membrane, ear  canal and external ear normal.     Nose: Mucosal edema, congestion and rhinorrhea present.     Right Turbinates: Enlarged and swollen. Pale: erythematous.    Left Turbinates: Enlarged and pale.     Right Sinus: No maxillary sinus tenderness or frontal sinus tenderness.     Left Sinus: No maxillary sinus tenderness or frontal sinus tenderness.     Mouth/Throat:     Lips: Pink.     Mouth: Mucous membranes are moist.     Pharynx: Oropharynx is clear. Uvula midline. No pharyngeal swelling, oropharyngeal exudate, posterior oropharyngeal erythema or uvula swelling.  Eyes:     General:        Right eye: No discharge.        Left eye: No discharge.     Conjunctiva/sclera: Conjunctivae normal.  Neck:     Trachea: No  tracheal deviation.  Cardiovascular:     Rate and Rhythm: Normal rate and regular rhythm.     Pulses: Normal pulses.     Heart sounds: Normal heart sounds.  Pulmonary:     Effort: Pulmonary effort is normal. No respiratory distress.     Breath sounds: No stridor.  Musculoskeletal:        General: Normal range of motion.  Skin:    General: Skin is warm and dry.     Findings: No rash.  Neurological:     Mental Status: He is alert.     Motor: No abnormal muscle tone.     Coordination: Coordination normal.  Psychiatric:        Behavior: Behavior normal.      Results for orders placed or performed in visit on 09/01/22  COMPLETE METABOLIC PANEL WITH GFR  Result Value Ref Range   Glucose, Bld 78 65 - 99 mg/dL   BUN 24 7 - 25 mg/dL   Creat 1.61 (H) 0.96 - 1.30 mg/dL   eGFR 62 > OR = 60 EA/VWU/9.81X9   BUN/Creatinine Ratio 17 6 - 22 (calc)   Sodium 143 135 - 146 mmol/L   Potassium 4.0 3.5 - 5.3 mmol/L   Chloride 107 98 - 110 mmol/L   CO2 27 20 - 32 mmol/L   Calcium 9.5 8.6 - 10.3 mg/dL   Total Protein 7.4 6.1 - 8.1 g/dL   Albumin 4.4 3.6 - 5.1 g/dL   Globulin 3.0 1.9 - 3.7 g/dL (calc)   AG Ratio 1.5 1.0 - 2.5 (calc)   Total Bilirubin 0.5 0.2 - 1.2 mg/dL   Alkaline phosphatase (APISO) 41 35 - 144 U/L   AST 34 10 - 35 U/L   ALT 19 9 - 46 U/L  Microalbumin / creatinine urine ratio  Result Value Ref Range   Creatinine, Urine 125 20 - 320 mg/dL   Microalb, Ur 2.8 mg/dL   Microalb Creat Ratio 22 <30 mcg/mg creat       Assessment & Plan:   1. Hypertension, unspecified type Here for f/up after BP med changed - amlodipine dose was increased Unfortunately BP high and uncontrolled today - he thinks due to OTC cold meds he's been taking He tolerated increased norvasc Amlodipine 7.5 at bedtime AM lisinopril 40 and HCTZ 12.5  BP Readings from Last 3 Encounters:  10/31/22 (!) 162/102  09/29/22 (!) 138/94  09/01/22 (!) 168/104   Refill on HCTZ - hydrochlorothiazide  (MICROZIDE) 12.5 MG capsule; Take 1 capsule (12.5 mg total) by mouth daily.  Dispense: 90 capsule; Refill: 1  He will get off the otc  cold/cough meds - encouraged meds below -  No NSAIDs, decongestants, cough meds, con't low salt  2-4 week f/up Will return with BP cuff with some readings and compare again in office, when off cold/allergy meds/cordicidin  If BP still uncontrolled we can make more med changes or have him consult with HTN clinic Would also recommend trying to max out HCTZ dose and amlodipine - for this pt we tend to have to go very slow with any med changes due to sensitivities and SE, thankfully most of them have resolved with time.    2. Rhinosinusitis No sinus ttp but very congested R>L Recommend 2nd gen antihistamines, saline nose sprays, steroid nose sprays - fluticasone (FLONASE) 50 MCG/ACT nasal spray; Place 2 sprays into both nostrils daily.  Dispense: 16 g; Refill: 6 - levocetirizine (XYZAL) 5 MG tablet; Take 1 tablet (5 mg total) by mouth at bedtime.  Dispense: 30 tablet; Refill: 2       Danelle Berry, PA-C 10/31/22 2:31 PM

## 2022-11-30 ENCOUNTER — Ambulatory Visit: Payer: Managed Care, Other (non HMO) | Admitting: Family Medicine

## 2022-12-24 ENCOUNTER — Other Ambulatory Visit: Payer: Self-pay | Admitting: Internal Medicine

## 2022-12-24 DIAGNOSIS — E782 Mixed hyperlipidemia: Secondary | ICD-10-CM

## 2022-12-26 NOTE — Telephone Encounter (Signed)
Requested Prescriptions  Pending Prescriptions Disp Refills   simvastatin (ZOCOR) 20 MG tablet [Pharmacy Med Name: Simvastatin 20 MG Oral Tablet] 90 tablet 0    Sig: TAKE 1 TABLET BY MOUTH AT BEDTIME     Cardiovascular:  Antilipid - Statins Failed - 12/24/2022  6:31 AM      Failed - Lipid Panel in normal range within the last 12 months    Cholesterol  Date Value Ref Range Status  03/22/2022 167 <200 mg/dL Final   LDL Cholesterol (Calc)  Date Value Ref Range Status  03/22/2022 91 mg/dL (calc) Final    Comment:    Reference range: <100 . Desirable range <100 mg/dL for primary prevention;   <70 mg/dL for patients with CHD or diabetic patients  with > or = 2 CHD risk factors. Marland Kitchen LDL-C is now calculated using the Martin-Hopkins  calculation, which is a validated novel method providing  better accuracy than the Friedewald equation in the  estimation of LDL-C.  Horald Pollen et al. Lenox Ahr. 6295;284(13): 2061-2068  (http://education.[facility].com/faq/FAQ164)    HDL  Date Value Ref Range Status  03/22/2022 61 > OR = 40 mg/dL Final   Triglycerides  Date Value Ref Range Status  03/22/2022 67 <150 mg/dL Final         Passed - Patient is not pregnant      Passed - Valid encounter within last 12 months    Recent Outpatient Visits           1 month ago Hypertension, unspecified type   [facility] Danelle Berry, PA-C   2 months ago Hypertension, unspecified type   [facility] Danelle Berry, PA-C   3 months ago Hypertension, unspecified type   [facility] Danelle Berry, PA-C   6 months ago Hypertension, unspecified type   [facility] Berniece Salines, FNP   7 months ago Hypertension, unspecified type   [facility] Margarita Mail, [place]

## 2023-01-26 ENCOUNTER — Other Ambulatory Visit: Payer: Self-pay | Admitting: Internal Medicine

## 2023-01-26 DIAGNOSIS — I10 Essential (primary) hypertension: Secondary | ICD-10-CM

## 2023-01-26 NOTE — Telephone Encounter (Signed)
Requested Prescriptions  Pending Prescriptions Disp Refills   lisinopril (ZESTRIL) 40 MG tablet [Pharmacy Med Name: Lisinopril 40 MG Oral Tablet] 90 tablet 0    Sig: Take 1 tablet by mouth once daily     Cardiovascular:  ACE Inhibitors Failed - 01/26/2023  6:31 AM      Failed - Cr in normal range and within 180 days    Creat  Date Value Ref Range Status  09/01/2022 1.38 (H) 0.70 - 1.30 mg/dL Final   Creatinine, Urine  Date Value Ref Range Status  09/01/2022 125 20 - 320 mg/dL Final         Failed - Last BP in normal range    BP Readings from Last 1 Encounters:  10/31/22 (!) 162/102         Passed - K in normal range and within 180 days    Potassium  Date Value Ref Range Status  09/01/2022 4.0 3.5 - 5.3 mmol/L Final         Passed - Patient is not pregnant      Passed - Valid encounter within last 6 months    Recent Outpatient Visits           2 months ago Hypertension, unspecified type   [facility] Danelle Berry, PA-C   3 months ago Hypertension, unspecified type   [facility] Danelle Berry, PA-C   4 months ago Hypertension, unspecified type   [facility] Danelle Berry, PA-C   7 months ago Hypertension, unspecified type   [facility] Berniece Salines, FNP   8 months ago Hypertension, unspecified type   [facility] Margarita Mail, [place]

## 2023-04-02 ENCOUNTER — Other Ambulatory Visit: Payer: Self-pay | Admitting: Family Medicine

## 2023-04-02 DIAGNOSIS — E782 Mixed hyperlipidemia: Secondary | ICD-10-CM

## 2023-05-07 ENCOUNTER — Other Ambulatory Visit: Payer: Self-pay | Admitting: Family Medicine

## 2023-05-07 DIAGNOSIS — I10 Essential (primary) hypertension: Secondary | ICD-10-CM

## 2023-05-08 ENCOUNTER — Other Ambulatory Visit: Payer: Self-pay

## 2023-05-08 DIAGNOSIS — I10 Essential (primary) hypertension: Secondary | ICD-10-CM

## 2023-05-08 MED ORDER — LISINOPRIL 40 MG PO TABS: 40.0000 mg | ORAL_TABLET | Freq: Every day | ORAL | 0 refills | Status: DC

## 2023-05-12 ENCOUNTER — Other Ambulatory Visit: Payer: Self-pay | Admitting: Family Medicine

## 2023-05-12 DIAGNOSIS — I10 Essential (primary) hypertension: Secondary | ICD-10-CM

## 2023-05-24 ENCOUNTER — Other Ambulatory Visit: Payer: Self-pay

## 2023-05-24 DIAGNOSIS — I10 Essential (primary) hypertension: Secondary | ICD-10-CM

## 2023-05-25 NOTE — Telephone Encounter (Signed)
Lvm for pt to return call to schedule appt due to last ov he had elevated bp.

## 2023-05-25 NOTE — Telephone Encounter (Signed)
Lvm for pt to call back and schedule an appt 

## 2023-06-02 ENCOUNTER — Other Ambulatory Visit: Payer: Self-pay | Admitting: Family Medicine

## 2023-06-02 DIAGNOSIS — I10 Essential (primary) hypertension: Secondary | ICD-10-CM

## 2023-06-09 ENCOUNTER — Other Ambulatory Visit: Payer: Self-pay | Admitting: Family Medicine

## 2023-06-09 DIAGNOSIS — I10 Essential (primary) hypertension: Secondary | ICD-10-CM

## 2023-06-12 ENCOUNTER — Other Ambulatory Visit: Payer: Self-pay | Admitting: Family Medicine

## 2023-06-12 DIAGNOSIS — I10 Essential (primary) hypertension: Secondary | ICD-10-CM

## 2023-06-14 ENCOUNTER — Encounter: Payer: Self-pay | Admitting: Nurse Practitioner

## 2023-06-14 ENCOUNTER — Ambulatory Visit (INDEPENDENT_AMBULATORY_CARE_PROVIDER_SITE_OTHER): Payer: No Typology Code available for payment source | Admitting: Nurse Practitioner

## 2023-06-14 VITALS — BP 182/102 | HR 90 | Temp 98.4°F | Resp 16 | Ht 68.0 in | Wt 237.5 lb

## 2023-06-14 DIAGNOSIS — I10 Essential (primary) hypertension: Secondary | ICD-10-CM

## 2023-06-14 DIAGNOSIS — E66812 Obesity, class 2: Secondary | ICD-10-CM | POA: Diagnosis not present

## 2023-06-14 DIAGNOSIS — E669 Obesity, unspecified: Secondary | ICD-10-CM

## 2023-06-14 DIAGNOSIS — Z6835 Body mass index (BMI) 35.0-35.9, adult: Secondary | ICD-10-CM

## 2023-06-14 DIAGNOSIS — E782 Mixed hyperlipidemia: Secondary | ICD-10-CM | POA: Diagnosis not present

## 2023-06-14 DIAGNOSIS — Z131 Encounter for screening for diabetes mellitus: Secondary | ICD-10-CM

## 2023-06-14 DIAGNOSIS — Z23 Encounter for immunization: Secondary | ICD-10-CM | POA: Diagnosis not present

## 2023-06-14 MED ORDER — HYDROCHLOROTHIAZIDE 12.5 MG PO CAPS: 12.5000 mg | ORAL_CAPSULE | Freq: Every day | ORAL | 1 refills | Status: DC

## 2023-06-14 MED ORDER — LISINOPRIL 40 MG PO TABS: 40.0000 mg | ORAL_TABLET | Freq: Every day | ORAL | 1 refills | Status: DC

## 2023-06-14 MED ORDER — AMLODIPINE BESYLATE 5 MG PO TABS: 5.0000 mg | ORAL_TABLET | Freq: Two times a day (BID) | ORAL | 1 refills | Status: DC

## 2023-06-14 NOTE — Progress Notes (Signed)
BP (!) 182/102 (BP Location: Right Arm, Patient Position: Sitting, Cuff Size: Large)   Pulse 90   Temp 98.4 F (36.9 C) (Oral)   Resp 16   Ht 5\' 8"  (1.727 m) Comment: per chart  Wt 237 lb 8 oz (107.7 kg)   SpO2 98%   BMI 36.11 kg/m    Subjective:    Patient ID: Jason Leonard, male    DOB: Jan 04, 1971, 52 y.o.   MRN: [id number]  HPI: Jason Leonard is a 52 y.o. male  Chief Complaint  Patient presents with   Medical Management of Chronic Issues    Discussed the use of AI scribe software for clinical note transcription with the patient, who gave verbal consent to proceed.  History of Present Illness   The patient, with a history of hypertension, obesity, and hyperlipidemia, presents after being off his antihypertensive medications for five days. He reports feeling fine and denies chest pain, shortness of breath, headaches, and blurred vision. He has been using Vicks nasal spray for nasal congestion. He has a home blood pressure monitor but does not use it regularly. His daughter, who used to check his blood pressure, recently moved out. He also reports having eaten [employer] minis prior to the appointment.       06/14/2023   11:52 AM 10/31/2022    2:15 PM 09/29/2022    2:08 PM  Depression screen PHQ 2/9  Decreased Interest 0 0 0  Down, Depressed, Hopeless 0 0 0  PHQ - 2 Score 0 0 0  Altered sleeping 0 0 0  Tired, decreased energy 0 0 0  Change in appetite 0 0 0  Feeling bad or failure about yourself  0 0 0  Trouble concentrating 0 0 0  Moving slowly or fidgety/restless 0 0 0  Suicidal thoughts 0 0 0  PHQ-9 Score 0 0 0  Difficult doing work/chores   Not difficult at all    Relevant past medical, surgical, family and social history reviewed and updated as indicated. Interim medical history since our last visit reviewed. Allergies and medications reviewed and updated.  Review of Systems  Constitutional: Negative for fever or weight change.  Respiratory: Negative for cough  and shortness of breath.   Cardiovascular: Negative for chest pain or palpitations.  Gastrointestinal: Negative for abdominal pain, no bowel changes.  Musculoskeletal: Negative for gait problem or joint swelling.  Skin: Negative for rash.  Neurological: Negative for dizziness or headache.  No other specific complaints in a complete review of systems (except as listed in HPI above).      Objective:    BP (!) 182/102 (BP Location: Right Arm, Patient Position: Sitting, Cuff Size: Large)   Pulse 90   Temp 98.4 F (36.9 C) (Oral)   Resp 16   Ht 5\' 8"  (1.727 m) Comment: per chart  Wt 237 lb 8 oz (107.7 kg)   SpO2 98%   BMI 36.11 kg/m   BP Readings from Last 3 Encounters:  06/14/23 (!) 182/102  10/31/22 (!) 162/102  09/29/22 (!) 138/94     Wt Readings from Last 3 Encounters:  06/14/23 237 lb 8 oz (107.7 kg)  10/31/22 228 lb 12.8 oz (103.8 kg)  09/29/22 229 lb (103.9 kg)    Physical Exam  Constitutional: Patient appears well-developed and well-nourished. Obese  No distress.  HEENT: head atraumatic, normocephalic, pupils equal and reactive to light, neck supple Cardiovascular: Normal rate, regular rhythm and normal heart sounds.  No murmur heard. No BLE edema.  Pulmonary/Chest: Effort normal and breath sounds normal. No respiratory distress. Abdominal: Soft.  There is no tenderness. Psychiatric: Patient has a normal mood and affect. behavior is normal. Judgment and thought content normal.  Results for orders placed or performed in visit on 09/01/22  COMPLETE METABOLIC PANEL WITH GFR  Result Value Ref Range   Glucose, Bld 78 65 - 99 mg/dL   BUN 24 7 - 25 mg/dL   Creat 2.84 (H) 1.32 - 1.30 mg/dL   eGFR 62 > OR = 60 GM/WNU/2.72Z3   BUN/Creatinine Ratio 17 6 - 22 (calc)   Sodium 143 135 - 146 mmol/L   Potassium 4.0 3.5 - 5.3 mmol/L   Chloride 107 98 - 110 mmol/L   CO2 27 20 - 32 mmol/L   Calcium 9.5 8.6 - 10.3 mg/dL   Total Protein 7.4 6.1 - 8.1 g/dL   Albumin 4.4 3.6 - 5.1  g/dL   Globulin 3.0 1.9 - 3.7 g/dL (calc)   AG Ratio 1.5 1.0 - 2.5 (calc)   Total Bilirubin 0.5 0.2 - 1.2 mg/dL   Alkaline phosphatase (APISO) 41 35 - 144 U/L   AST 34 10 - 35 U/L   ALT 19 9 - 46 U/L  Microalbumin / creatinine urine ratio  Result Value Ref Range   Creatinine, Urine 125 20 - 320 mg/dL   Microalb, Ur 2.8 mg/dL   Microalb Creat Ratio 22 <30 mcg/mg creat   Last lipids Lab Results  Component Value Date   CHOL 167 03/22/2022   HDL 61 03/22/2022   LDLCALC 91 03/22/2022   TRIG 67 03/22/2022   CHOLHDL 2.7 03/22/2022   Last hemoglobin A1c Lab Results  Component Value Date   HGBA1C 5.6 03/31/2021        Assessment & Plan:   Problem List Items Addressed This Visit       Cardiovascular and Mediastinum   Hypertension - Primary   Relevant Medications   amLODipine (NORVASC) 5 MG tablet   hydrochlorothiazide (MICROZIDE) 12.5 MG capsule   lisinopril (ZESTRIL) 40 MG tablet   Other Relevant Orders   CBC with Differential/Platelet   COMPLETE METABOLIC PANEL WITH GFR     Other   Hyperlipidemia   Relevant Medications   amLODipine (NORVASC) 5 MG tablet   hydrochlorothiazide (MICROZIDE) 12.5 MG capsule   lisinopril (ZESTRIL) 40 MG tablet   Other Relevant Orders   Lipid panel   Obesity (BMI 30-39.9)    Co morbidities include HTN, HLD      Other Visit Diagnoses     Class 2 obesity with body mass index (BMI) of 35.0 to 35.9 in adult, unspecified obesity type, unspecified whether serious comorbidity present       Need for immunization against influenza       Screening for diabetes mellitus       Relevant Orders   COMPLETE METABOLIC PANEL WITH GFR   Hemoglobin A1c        Assessment and Plan    Uncontrolled Hypertension Blood pressure significantly elevated at 190/110. Patient has been off antihypertensive medications for 5 days. No symptoms of hypertensive urgency or emergency reported. -Resume antihypertensive medications:Amlodipine 5mg  BID,  Hydrochlorothiazide 12.5mg  daily, Lisinopril 40mg  daily. -Check blood pressure at home and return in 1 week for blood pressure check. -Advised to seek immediate medical attention if experiencing any stroke-like symptoms.  Hyperlipidemia Currently on Simvastatin 20mg  daily. -Continue current medication.  Obesity work on lifestyle modification  General Health Maintenance -Complete labs at next visit.  Follow up plan: Return in about 1 week (around 06/21/2023) for nurse visit.

## 2023-06-14 NOTE — Assessment & Plan Note (Signed)
Co morbidities include HTN, HLD

## 2023-06-22 ENCOUNTER — Other Ambulatory Visit: Payer: Self-pay | Admitting: Nurse Practitioner

## 2023-06-22 ENCOUNTER — Ambulatory Visit: Payer: No Typology Code available for payment source

## 2023-06-22 VITALS — BP 154/96

## 2023-06-22 DIAGNOSIS — I10 Essential (primary) hypertension: Secondary | ICD-10-CM

## 2023-06-22 MED ORDER — HYDROCHLOROTHIAZIDE 12.5 MG PO CAPS: 25.0000 mg | ORAL_CAPSULE | Freq: Every day | ORAL | Status: DC

## 2023-06-22 NOTE — Progress Notes (Signed)
Patient is in office today for a nurse visit for Blood Pressure Check. Patient blood pressure was 154/96, Patient No chest pain, No shortness of breath, No dyspnea on exertion, No orthopnea, No paroxysmal nocturnal dyspnea, No edema, No palpitations, No syncope. Pt states at home BP readings have been in the 150s/90s average after restarting medications, he has been compliant with medications.

## 2023-06-22 NOTE — Progress Notes (Signed)
Increase hydrochlorothiazide to 25 mg daily, due to blood pressure still high.

## 2023-06-23 LAB — CBC WITH DIFFERENTIAL/PLATELET
Absolute Lymphocytes: 3052 {cells}/uL (ref 850–3900)
Absolute Monocytes: 364 {cells}/uL (ref 200–950)
Basophils Absolute: 28 {cells}/uL (ref 0–200)
Basophils Relative: 0.5 %
Eosinophils Absolute: 90 {cells}/uL (ref 15–500)
Eosinophils Relative: 1.6 %
HCT: 43.9 % (ref 38.5–50.0)
Hemoglobin: 13.6 g/dL (ref 13.2–17.1)
MCH: 26.9 pg — ABNORMAL LOW (ref 27.0–33.0)
MCHC: 31 g/dL — ABNORMAL LOW (ref 32.0–36.0)
MCV: 86.8 fL (ref 80.0–100.0)
MPV: 13.2 fL — ABNORMAL HIGH (ref 7.5–12.5)
Monocytes Relative: 6.5 %
Neutro Abs: 2066 {cells}/uL (ref 1500–7800)
Neutrophils Relative %: 36.9 %
Platelets: 160 10*3/uL (ref 140–400)
RBC: 5.06 10*6/uL (ref 4.20–5.80)
RDW: 12.7 % (ref 11.0–15.0)
Total Lymphocyte: 54.5 %
WBC: 5.6 10*3/uL (ref 3.8–10.8)

## 2023-06-23 LAB — COMPLETE METABOLIC PANEL WITH GFR
AG Ratio: 1.6 (calc) (ref 1.0–2.5)
ALT: 26 U/L (ref 9–46)
AST: 41 U/L — ABNORMAL HIGH (ref 10–35)
Albumin: 4.4 g/dL (ref 3.6–5.1)
Alkaline phosphatase (APISO): 44 U/L (ref 35–144)
BUN: 20 mg/dL (ref 7–25)
CO2: 30 mmol/L (ref 20–32)
Calcium: 9.3 mg/dL (ref 8.6–10.3)
Chloride: 103 mmol/L (ref 98–110)
Creat: 1.28 mg/dL (ref 0.70–1.30)
Globulin: 2.8 g/dL (ref 1.9–3.7)
Glucose, Bld: 93 mg/dL (ref 65–99)
Potassium: 3.8 mmol/L (ref 3.5–5.3)
Sodium: 140 mmol/L (ref 135–146)
Total Bilirubin: 0.6 mg/dL (ref 0.2–1.2)
Total Protein: 7.2 g/dL (ref 6.1–8.1)
eGFR: 67 mL/min/{1.73_m2} (ref >=60)

## 2023-06-23 LAB — HEMOGLOBIN A1C
Hgb A1c MFr Bld: 6.1 %{Hb} — ABNORMAL HIGH (ref <5.7)
Mean Plasma Glucose: 128 mg/dL
eAG (mmol/L): 7.1 mmol/L

## 2023-06-23 LAB — LIPID PANEL
Cholesterol: 158 mg/dL (ref <200)
HDL: 56 mg/dL (ref >=40)
LDL Cholesterol (Calc): 86 mg/dL
Non-HDL Cholesterol (Calc): 102 mg/dL (ref <130)
Total CHOL/HDL Ratio: 2.8 (calc) (ref <5.0)
Triglycerides: 69 mg/dL (ref <150)

## 2023-07-04 ENCOUNTER — Other Ambulatory Visit: Payer: Self-pay | Admitting: Family Medicine

## 2023-07-04 DIAGNOSIS — E782 Mixed hyperlipidemia: Secondary | ICD-10-CM

## 2023-10-04 ENCOUNTER — Other Ambulatory Visit: Payer: Self-pay | Admitting: Family Medicine

## 2023-10-04 DIAGNOSIS — E782 Mixed hyperlipidemia: Secondary | ICD-10-CM

## 2023-10-04 NOTE — Telephone Encounter (Signed)
 Lvm to schedule appt before June and his meds have been sent to pharmacy

## 2023-10-04 NOTE — Telephone Encounter (Signed)
 Will need f/u by june

## 2023-11-06 ENCOUNTER — Encounter: Payer: Self-pay | Admitting: Family Medicine

## 2023-11-06 ENCOUNTER — Ambulatory Visit (INDEPENDENT_AMBULATORY_CARE_PROVIDER_SITE_OTHER): Admitting: Family Medicine

## 2023-11-06 VITALS — BP 136/88 | HR 69 | Resp 16 | Ht 68.0 in | Wt 236.0 lb

## 2023-11-06 DIAGNOSIS — E782 Mixed hyperlipidemia: Secondary | ICD-10-CM | POA: Diagnosis not present

## 2023-11-06 DIAGNOSIS — I10 Essential (primary) hypertension: Secondary | ICD-10-CM

## 2023-11-06 DIAGNOSIS — E669 Obesity, unspecified: Secondary | ICD-10-CM

## 2023-11-06 DIAGNOSIS — R7303 Prediabetes: Secondary | ICD-10-CM

## 2023-11-06 MED ORDER — LISINOPRIL 40 MG PO TABS: 40.0000 mg | ORAL_TABLET | Freq: Every day | ORAL | 1 refills | Status: DC

## 2023-11-06 MED ORDER — HYDROCHLOROTHIAZIDE 12.5 MG PO CAPS: 12.5000 mg | ORAL_CAPSULE | Freq: Every day | ORAL | 1 refills | Status: DC

## 2023-11-06 MED ORDER — SIMVASTATIN 20 MG PO TABS: 20.0000 mg | ORAL_TABLET | Freq: Every day | ORAL | 1 refills | Status: DC

## 2023-11-06 MED ORDER — AMLODIPINE BESYLATE 5 MG PO TABS: 5.0000 mg | ORAL_TABLET | Freq: Every day | ORAL | 1 refills | Status: AC

## 2023-11-06 NOTE — Progress Notes (Unsigned)
 Name: Jason Leonard   MRN: [id number]    DOB: 1970/08/04   Date:11/06/2023       Progress Note  Chief Complaint  Patient presents with   Medical Management of Chronic Issues     Subjective:   Kiyon Mullikin is a 53 y.o. male, presents to clinic for routine follow up on chronic conditions  BP Readings from Last 3 Encounters:  11/06/23 136/88  06/22/23 (!) 154/96  06/14/23 (!) 182/102    HTN fairly well controlled today, med changes by other provider a few months ago  Hyperlipidemia: Currently treated with ***, pt reports *** med compliance Last Lipids: Lab Results  Component Value Date   CHOL 158 06/22/2023   HDL 56 06/22/2023   LDLCALC 86 06/22/2023   TRIG 69 06/22/2023   CHOLHDL 2.8 06/22/2023   - {ACTIONS;DENIES/REPORTS:21021675::"Denies"}: Chest pain, shortness of breath, myalgias, claudication    Current Outpatient Medications:    amLODipine  (NORVASC ) 5 MG tablet, Take 1 tablet (5 mg total) by mouth 2 (two) times daily., Disp: 180 tablet, Rfl: 1   hydrochlorothiazide  (MICROZIDE ) 12.5 MG capsule, Take 2 capsules (25 mg total) by mouth daily., Disp: , Rfl:    lisinopril  (ZESTRIL ) 40 MG tablet, Take 1 tablet (40 mg total) by mouth daily., Disp: 90 tablet, Rfl: 1   simvastatin  (ZOCOR ) 20 MG tablet, TAKE 1 TABLET BY MOUTH AT BEDTIME, Disp: 90 tablet, Rfl: 0   fluticasone  (FLONASE ) 50 MCG/ACT nasal spray, Place 2 sprays into both nostrils daily. (Patient not taking: Reported on 11/06/2023), Disp: 16 g, Rfl: 6   levocetirizine (XYZAL ) 5 MG tablet, Take 1 tablet (5 mg total) by mouth at bedtime. (Patient not taking: Reported on 11/06/2023), Disp: 30 tablet, Rfl: 2  Patient Active Problem List   Diagnosis Date Noted   Obesity (BMI 30-39.9) 12/17/2020   Hypertension 06/14/2018   Hyperlipidemia 06/14/2018    Past Surgical History:  Procedure Laterality Date   ARTHROSCOPIC REPAIR ACL     COLONOSCOPY WITH PROPOFOL  N/A 01/09/2020   Procedure: COLONOSCOPY WITH PROPOFOL ;  Surgeon:  Luke Salaam, MD;  Location: [facility] ENDOSCOPY;  Service: Gastroenterology;  Laterality: N/A;   WISDOM TOOTH EXTRACTION      Family History  Problem Relation Age of Onset   Hypertension Mother    Hypertension Father    Lupus Sister    Diabetes Maternal Grandfather    Cancer Maternal Grandfather    Cancer Paternal Grandmother     Social History   Tobacco Use   Smoking status: Never   Smokeless tobacco: Never  Vaping Use   Vaping status: Never Used  Substance Use Topics   Alcohol use: No   Drug use: No     No Known Allergies  Health Maintenance  Topic Date Due   COVID-19 Vaccine (3 - 2024-25 season) 03/05/2023   Zoster Vaccines- Shingrix (1 of 2) 02/06/2024 (Originally 09/20/2020)   INFLUENZA VACCINE  02/02/2024   Colonoscopy  01/08/2030   DTaP/Tdap/Td (2 - Td or Tdap) 03/16/2032   HIV Screening  Completed   HPV VACCINES  Aged Out   Meningococcal B Vaccine  Aged Out    Chart Review Today: ***  Review of Systems   Objective:   Vitals:   11/06/23 1502  BP: 136/88  Pulse: 69  Resp: 16  SpO2: 98%  Weight: 236 lb (107 kg)  Height: 5\' 8"  (1.727 m)    Body mass index is 35.88 kg/m.  Physical Exam   Functional Status Survey:   Results  for orders placed or performed in visit on 06/14/23  CBC with Differential/Platelet   Collection Time: 06/22/23  2:26 PM  Result Value Ref Range   WBC 5.6 3.8 - 10.8 Thousand/uL   RBC 5.06 4.20 - 5.80 Million/uL   Hemoglobin 13.6 13.2 - 17.1 g/dL   HCT 16.1 09.6 - 04.5 %   MCV 86.8 80.0 - 100.0 fL   MCH 26.9 (L) 27.0 - 33.0 pg   MCHC 31.0 (L) 32.0 - 36.0 g/dL   RDW 40.9 81.1 - 91.4 %   Platelets 160 140 - 400 Thousand/uL   MPV 13.2 (H) 7.5 - 12.5 fL   Neutro Abs 2,066 1,500 - 7,800 cells/uL   Absolute Lymphocytes 3,052 850 - 3,900 cells/uL   Absolute Monocytes 364 200 - 950 cells/uL   Eosinophils Absolute 90 15 - 500 cells/uL   Basophils Absolute 28 0 - 200 cells/uL   Neutrophils Relative % 36.9 %   Total Lymphocyte  54.5 %   Monocytes Relative 6.5 %   Eosinophils Relative 1.6 %   Basophils Relative 0.5 %  COMPLETE METABOLIC PANEL WITH GFR   Collection Time: 06/22/23  2:26 PM  Result Value Ref Range   Glucose, Bld 93 65 - 99 mg/dL   BUN 20 7 - 25 mg/dL   Creat 7.82 9.56 - 2.13 mg/dL   eGFR 67 > OR = 60 YQ/MVH/8.46N6   BUN/Creatinine Ratio SEE NOTE: 6 - 22 (calc)   Sodium 140 135 - 146 mmol/L   Potassium 3.8 3.5 - 5.3 mmol/L   Chloride 103 98 - 110 mmol/L   CO2 30 20 - 32 mmol/L   Calcium 9.3 8.6 - 10.3 mg/dL   Total Protein 7.2 6.1 - 8.1 g/dL   Albumin 4.4 3.6 - 5.1 g/dL   Globulin 2.8 1.9 - 3.7 g/dL (calc)   AG Ratio 1.6 1.0 - 2.5 (calc)   Total Bilirubin 0.6 0.2 - 1.2 mg/dL   Alkaline phosphatase (APISO) 44 35 - 144 U/L   AST 41 (H) 10 - 35 U/L   ALT 26 9 - 46 U/L  Lipid panel   Collection Time: 06/22/23  2:26 PM  Result Value Ref Range   Cholesterol 158 <200 mg/dL   HDL 56 > OR = 40 mg/dL   Triglycerides 69 <295 mg/dL   LDL Cholesterol (Calc) 86 mg/dL (calc)   Total CHOL/HDL Ratio 2.8 <5.0 (calc)   Non-HDL Cholesterol (Calc) 102 <130 mg/dL (calc)  Hemoglobin M8U   Collection Time: 06/22/23  2:26 PM  Result Value Ref Range   Hgb A1c MFr Bld 6.1 (H) <5.7 % of total Hgb   Mean Plasma Glucose 128 mg/dL   eAG (mmol/L) 7.1 mmol/L      Assessment & Plan:   There are no diagnoses linked to this encounter.   No follow-ups on file.   Adeline Hone, PA-C 11/06/23 3:16 PM

## 2023-11-07 ENCOUNTER — Encounter: Payer: Self-pay | Admitting: Family Medicine

## 2023-11-07 DIAGNOSIS — R7303 Prediabetes: Secondary | ICD-10-CM | POA: Insufficient documentation

## 2023-11-07 NOTE — Assessment & Plan Note (Signed)
 Wt Readings from Last 5 Encounters:  11/06/23 236 lb (107 kg)  06/14/23 237 lb 8 oz (107.7 kg)  10/31/22 228 lb 12.8 oz (103.8 kg)  09/29/22 229 lb (103.9 kg)  09/01/22 232 lb 11.2 oz (105.6 kg)   BMI Readings from Last 5 Encounters:  11/06/23 35.88 kg/m  06/14/23 36.11 kg/m  10/31/22 34.79 kg/m  09/29/22 34.82 kg/m  09/01/22 35.38 kg/m   Not exercising, but physical job Comorbidities include hypertension and hyperlipidemia

## 2023-11-07 NOTE — Assessment & Plan Note (Signed)
 HTN fairly well controlled today He is out of some meds, and recently had very elevated blood pressure readings in office when he ran out of medications.  His doses were increased but he had to be side effects and return to his regular doses daily which includes lisinopril  40 mg, hydrochlorothiazide  12.5 mg and amlodipine  5 mg Good compliance when he has all of his medications, he does work on diet efforts and he has a fairly active and physical job but is not otherwise exercising BP Readings from Last 3 Encounters:  11/06/23 136/88  06/22/23 (!) 154/96  06/14/23 (!) 182/102  Meds refilled, he wishes to return next week or Friday for labs

## 2023-11-07 NOTE — Assessment & Plan Note (Signed)
 New prediabetes with his last office visit and lab done by a different provider He would like to recheck labs  Lab Results  Component Value Date   HGBA1C 6.1 (H) 06/22/2023   HGBA1C 5.6 03/31/2021   HGBA1C 5.6 06/12/2018

## 2023-11-07 NOTE — Assessment & Plan Note (Signed)
 Due for lipids, on simvastatin  with good compliance.  He has run out of medications or refills When he takes daily he denies any side effects or concerns Lab Results  Component Value Date   CHOL 158 06/22/2023   HDL 56 06/22/2023   LDLCALC 86 06/22/2023   TRIG 69 06/22/2023   CHOLHDL 2.8 06/22/2023  Denies any exertional chest pain, leg pain edema myalgias

## 2023-12-06 ENCOUNTER — Other Ambulatory Visit: Payer: Self-pay | Admitting: Nurse Practitioner

## 2023-12-06 DIAGNOSIS — I10 Essential (primary) hypertension: Secondary | ICD-10-CM

## 2023-12-07 NOTE — Telephone Encounter (Signed)
 Requested medications are due for refill today.  unsure  Requested medications are on the active medications list.  yes  Last refill. 11/06/2023 #90 1 rf  Future visit scheduled.   yes  Notes to clinic.  Sig on med list does not match sig on rx refill request.    Requested Prescriptions  Pending Prescriptions Disp Refills   amLODipine  (NORVASC ) 5 MG tablet [Pharmacy Med Name: amLODIPine  Besylate 5 MG Oral Tablet] 180 tablet 0    Sig: Take 1 tablet by mouth twice daily     Cardiovascular: Calcium Channel Blockers 2 Failed - 12/07/2023 10:22 AM      Failed - Valid encounter within last 6 months    Recent Outpatient Visits           1 month ago Hypertension, unspecified type   [facility] Adeline Hone, PA-C       Future Appointments             In 5 months Tapia, Leisa, PA-C [facility]            Passed - Last BP in normal range    BP Readings from Last 1 Encounters:  11/06/23 136/88         Passed - Last Heart Rate in normal range    Pulse Readings from Last 1 Encounters:  11/06/23 69

## 2024-05-10 ENCOUNTER — Encounter: Admitting: Family Medicine

## 2024-05-24 ENCOUNTER — Encounter: Payer: Self-pay | Admitting: Nurse Practitioner

## 2024-05-24 ENCOUNTER — Ambulatory Visit (INDEPENDENT_AMBULATORY_CARE_PROVIDER_SITE_OTHER): Admitting: Nurse Practitioner

## 2024-05-24 VITALS — BP 130/80 | HR 61 | Temp 97.7°F | Ht 68.0 in | Wt 244.0 lb

## 2024-05-24 DIAGNOSIS — E66812 Obesity, class 2: Secondary | ICD-10-CM

## 2024-05-24 DIAGNOSIS — Z0000 Encounter for general adult medical examination without abnormal findings: Secondary | ICD-10-CM | POA: Diagnosis not present

## 2024-05-24 DIAGNOSIS — Z125 Encounter for screening for malignant neoplasm of prostate: Secondary | ICD-10-CM

## 2024-05-24 DIAGNOSIS — Z6835 Body mass index (BMI) 35.0-35.9, adult: Secondary | ICD-10-CM | POA: Insufficient documentation

## 2024-05-24 DIAGNOSIS — R7303 Prediabetes: Secondary | ICD-10-CM | POA: Diagnosis not present

## 2024-05-24 DIAGNOSIS — E782 Mixed hyperlipidemia: Secondary | ICD-10-CM | POA: Diagnosis not present

## 2024-05-24 DIAGNOSIS — I10 Essential (primary) hypertension: Secondary | ICD-10-CM | POA: Diagnosis not present

## 2024-05-24 NOTE — Progress Notes (Signed)
 Name: Jason Leonard   MRN: [id number]    DOB: May 11, 1971   Date:05/24/2024       Progress Note  Subjective  Chief Complaint  Chief Complaint  Patient presents with   Annual Exam    Pt c/o back pain.     HPI  Patient presents for annual CPE . Discussed the use of AI scribe software for clinical note transcription with the patient, who gave verbal consent to proceed.  History of Present Illness Jason Leonard is a 53 year old male who presents for an annual physical exam.  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, including unloading trailers - Estimates 25,000 steps daily - Attempts to maintain a balanced diet with salads and vegetables - Difficulty with portion control, especially with chicken  Sleep patterns - Obtains 6 to 8 hours of sleep depending on work schedule - Work requires waking up around midnight  Preventive health maintenance - No recent dental exam - Eye exam performed about one year ago - No prior EKG - Due for PSA test - Up to date on colorectal cancer screening - Does not qualify for lung cancer screening    IPSS     Row Name 05/24/24 1035         International Prostate Symptom Score   How often have you had the sensation of not emptying your bladder? Not at All     How often have you had to urinate less than every two hours? Not at All     How often have you found you stopped and started again several times when you urinated? Not at All     How often have you  found it difficult to postpone urination? Not at All     How often have you had a weak urinary stream? Not at All     How often have you had to strain to start urination? Not at All     How many times did you typically get up at night to urinate? 1 Time     Total IPSS Score 1       Quality of Life due to urinary symptoms   If you were to spend the rest of your life with your urinary condition just the way it is now how would you feel about that? Pleased         Diet: well balanced diet, salads Exercise: physical job, recommend 150 min of physical activity weekly   Sleep: 6-8 hours Last dental exam:been awhile Last eye exam: last year  Depression: phq 9 is negative    05/24/2024   10:22 AM 06/14/2023   11:52 AM 10/31/2022    2:15 PM 09/29/2022    2:08 PM 09/01/2022    1:37 PM  Depression screen PHQ 2/9  Decreased Interest 0 0 0 0 0  Down, Depressed, Hopeless 0 0 0 0 0  PHQ - 2 Score 0 0 0 0 0  Altered sleeping  0 0 0 0  Tired, decreased  energy  0 0 0 0  Change in appetite  0 0 0 0  Feeling bad or failure about yourself   0 0 0 0  Trouble concentrating  0 0 0 0  Moving slowly or fidgety/restless  0 0 0 0  Suicidal thoughts  0 0 0 0  PHQ-9 Score  0  0  0  0   Difficult doing work/chores    Not difficult at all Not difficult at all     Data saved with a previous flowsheet row definition    Hypertension:  BP Readings from Last 3 Encounters:  05/24/24 130/80  11/06/23 136/88  06/22/23 (!) 154/96    Obesity: Wt Readings from Last 3 Encounters:  05/24/24 244 lb (110.7 kg)  11/06/23 236 lb (107 kg)  06/14/23 237 lb 8 oz (107.7 kg)   BMI Readings from Last 3 Encounters:  05/24/24 37.10 kg/m  11/06/23 35.88 kg/m  06/14/23 36.11 kg/m     Lipids:  Lab Results  Component Value Date   CHOL 158 06/22/2023   CHOL 167 03/22/2022   CHOL 144 03/31/2021   Lab Results  Component Value Date   HDL 56 06/22/2023   HDL 61 03/22/2022   HDL 56 03/31/2021   Lab Results   Component Value Date   LDLCALC 86 06/22/2023   LDLCALC 91 03/22/2022   LDLCALC 66 03/31/2021   Lab Results  Component Value Date   TRIG 69 06/22/2023   TRIG 67 03/22/2022   TRIG 135 03/31/2021   Lab Results  Component Value Date   CHOLHDL 2.8 06/22/2023   CHOLHDL 2.7 03/22/2022   CHOLHDL 2.6 03/31/2021   No results found for: LDLDIRECT Glucose:  Glucose, Bld  Date Value Ref Range Status  06/22/2023 93 65 - 99 mg/dL Final    Comment:    .            Fasting reference interval .   09/01/2022 78 65 - 99 mg/dL Final    Comment:    .            Fasting reference interval .   03/22/2022 87 65 - 99 mg/dL Final    Comment:    .            Fasting reference interval .     Flowsheet Row Office Visit from 05/24/2024 in [facility]  AUDIT-C Score 0     Married STD testing and prevention (HIV/chl/gon/syphilis): completed  Skin cancer: Discussed monitoring for atypical lesions Colorectal cancer: 01/09/2020 Prostate cancer: ordered Lab Results  Component Value Date   PSA 1.5 06/12/2018     Lung cancer:   Low Dose CT Chest recommended if Age 93-80 years, 30 pack-year currently smoking OR have quit w/in 15years. Patient does not qualify.   AAA:  The USPSTF recommends one-time screening with ultrasonography in men ages 27 to 89 years who have ever smoked ECG:  none  Vaccines:  HPV: up to at age 1 , ask insurance if age between 25-45  Shingrix: 52-64 yo and ask insurance if covered when patient above 27 yo Pneumonia:  educated and discussed with patient. Flu:  educated and discussed with patient.  Advanced Care Planning: A voluntary discussion about advance care planning including the explanation and discussion of advance directives.  Discussed health care proxy and Living will, and the patient was able to identify a health care proxy as Wife.  Patient does not have a living will at  present time. If patient does have living will, I have  requested they bring this to the clinic to be scanned in to their chart.  Patient Active Problem List   Diagnosis Date Noted   Class 2 obesity with body mass index (BMI) of 35.0 to 35.9 in adult 05/24/2024   Prediabetes 11/07/2023   Obesity (BMI 30-39.9) 12/17/2020   Hypertension 06/14/2018   Hyperlipidemia 06/14/2018    Past Surgical History:  Procedure Laterality Date   ARTHROSCOPIC REPAIR ACL     COLONOSCOPY WITH PROPOFOL  N/A 01/09/2020   Procedure: COLONOSCOPY WITH PROPOFOL ;  Surgeon: Therisa Bi, MD;  Location: [facility] ENDOSCOPY;  Service: Gastroenterology;  Laterality: N/A;   WISDOM TOOTH EXTRACTION      Family History  Problem Relation Age of Onset   Hypertension Mother    Hypertension Father    Lupus Sister    Diabetes Maternal Grandfather    Cancer Maternal Grandfather    Cancer Paternal Grandmother     Social History   Socioeconomic History   Marital status: Married    Spouse name: Not on file   Number of children: 2   Years of education: Not on file   Highest education level: Not on file  Occupational History   Not on file  Tobacco Use   Smoking status: Never   Smokeless tobacco: Never  Vaping Use   Vaping status: Never Used  Substance and Sexual Activity   Alcohol use: No   Drug use: No   Sexual activity: Yes    Comment: one partner wife - 2011  Other Topics Concern   Not on file  Social History Narrative   Not on file   Social Drivers of Health   Financial Resource Strain: Low Risk  (05/24/2024)   Overall Financial Resource Strain (CARDIA)    Difficulty of Paying Living Expenses: Not hard at all  Food Insecurity: No Food Insecurity (05/24/2024)   Hunger Vital Sign    Worried About Running Out of Food in the Last Year: Never true    Ran Out of Food in the Last Year: Never true  Transportation Needs: No Transportation Needs (05/24/2024)   PRAPARE - Administrator, Civil Service (Medical): No    Lack of Transportation (Non-Medical): No   Physical Activity: Sufficiently Active (05/24/2024)   Exercise Vital Sign    Days of Exercise per Week: 5 days    Minutes of Exercise per Session: 60 min  Stress: No Stress Concern Present (05/24/2024)   [employer] of Occupational Health - Occupational Stress Questionnaire    Feeling of Stress: Not at all  Social Connections: Moderately Integrated (05/24/2024)   Social Connection and Isolation Panel    Frequency of Communication with Friends and Family: More than three times a week    Frequency of Social Gatherings with Friends and Family: Twice a week    Attends Religious Services: More than 4 times per year    Active Member of [employer] or Organizations: No    Attends Banker Meetings: Never    Marital Status: Married  Catering Manager Violence: Not At Risk (05/24/2024)   Humiliation, Afraid, Rape, and Kick questionnaire    Fear of Current or Ex-Partner: No    Emotionally Abused: No    Physically Abused: No    Sexually Abused: No     Current Outpatient Medications:    amLODipine  (NORVASC ) 5 MG tablet, Take 1 tablet (5 mg total) by mouth daily., Disp: 90 tablet, Rfl: 1  hydrochlorothiazide  (MICROZIDE ) 12.5 MG capsule, Take 1 capsule (12.5 mg total) by mouth daily., Disp: 90 capsule, Rfl: 1   lisinopril  (ZESTRIL ) 40 MG tablet, Take 1 tablet (40 mg total) by mouth daily., Disp: 90 tablet, Rfl: 1   simvastatin  (ZOCOR ) 20 MG tablet, Take 1 tablet (20 mg total) by mouth at bedtime., Disp: 90 tablet, Rfl: 1  No Known Allergies   ROS  Constitutional: Negative for fever or weight change.  Respiratory: Negative for cough and shortness of breath.   Cardiovascular: Negative for chest pain or palpitations.  Gastrointestinal: Negative for abdominal pain, no bowel changes.  Musculoskeletal: Negative for gait problem or joint swelling.  Skin: Negative for rash.  Neurological: Negative for dizziness or headache.  No other specific complaints in a complete review of  systems (except as listed in HPI above).    Objective  Vitals:   05/24/24 1017 05/24/24 1052  BP: (!) 138/90 130/80  Pulse: 61   Temp: 97.7 F (36.5 C)   SpO2: 96%   Weight: 244 lb (110.7 kg)   Height: 5' 8 (1.727 m)     Body mass index is 37.1 kg/m.  Physical Exam Vitals reviewed.  Constitutional:      Appearance: Normal appearance.  HENT:     Head: Normocephalic.     Right Ear: Tympanic membrane normal.     Left Ear: Tympanic membrane normal.     Nose: Nose normal.  Eyes:     Extraocular Movements: Extraocular movements intact.     Conjunctiva/sclera: Conjunctivae normal.     Pupils: Pupils are equal, round, and reactive to light.  Neck:     Thyroid: No thyroid mass, thyromegaly or thyroid tenderness.  Cardiovascular:     Rate and Rhythm: Normal rate and regular rhythm.     Pulses: Normal pulses.     Heart sounds: Normal heart sounds.  Pulmonary:     Effort: Pulmonary effort is normal.     Breath sounds: Normal breath sounds.  Abdominal:     General: Bowel sounds are normal.     Palpations: Abdomen is soft.  Musculoskeletal:        General: Normal range of motion.     Cervical back: Normal range of motion and neck supple.     Right lower leg: No edema.     Left lower leg: No edema.  Skin:    General: Skin is warm and dry.     Capillary Refill: Capillary refill takes less than 2 seconds.  Neurological:     General: No focal deficit present.     Mental Status: He is alert and oriented to person, place, and time. Mental status is at baseline.  Psychiatric:        Mood and Affect: Mood normal.        Behavior: Behavior normal.        Thought Content: Thought content normal.        Judgment: Judgment normal.      No results found for this or any previous visit (from the past 2160 hours).   Fall Risk:    06/14/2023   11:56 AM 10/31/2022    2:15 PM 09/29/2022    2:08 PM 09/01/2022    1:37 PM 06/02/2022    1:29 PM  Fall Risk   Falls in the past  year? 0 0 0 0 0  Number falls in past yr:   0 0 0  Injury with Fall?   0 0 0  Risk  for fall due to : No Fall Risks No Fall Risks No Fall Risks No Fall Risks   Follow up Falls prevention discussed Falls prevention discussed Falls prevention discussed;Education provided;Falls evaluation completed Falls prevention discussed;Education provided;Falls evaluation completed Falls evaluation completed      Data saved with a previous flowsheet row definition      Functional Status Survey: Is the patient deaf or have difficulty hearing?: No Does the patient have difficulty seeing, even when wearing glasses/contacts?: No Does the patient have difficulty concentrating, remembering, or making decisions?: No Does the patient have difficulty walking or climbing stairs?: No Does the patient have difficulty dressing or bathing?: No Does the patient have difficulty doing errands alone such as visiting a doctor's office or shopping?: No    Assessment & Plan  Problem List Items Addressed This Visit       Cardiovascular and Mediastinum   Hypertension   Relevant Orders   CBC with Differential/Platelet   Comprehensive metabolic panel with GFR     Other   Hyperlipidemia   Relevant Orders   Lipid panel   Prediabetes   Relevant Orders   Hemoglobin A1c   Class 2 obesity with body mass index (BMI) of 35.0 to 35.9 in adult   Other Visit Diagnoses       Annual physical exam    -  Primary   Relevant Orders   CBC with Differential/Platelet   Comprehensive metabolic panel with GFR   Hemoglobin A1c   Lipid panel   PSA     Screening for prostate cancer       Relevant Orders   PSA      Assessment and Plan Assessment & Plan Adult Wellness Visit Annual physical examination conducted. Blood pressure elevated at 138/90 mmHg. Up to date on colorectal cancer screening. Does not qualify for lung cancer screening. Last eye exam was about a year ago. Last dental exam was a while ago. IPSS score is 1. -  Rechecked blood pressure before leaving - Ordered PSA test - Ordered regular labs  Essential hypertension Blood pressure elevated at 138/90 mmHg. Currently on amlodipine  5 mg daily, hydrochlorothiazide  12.5 mg daily, and lisinopril  40 mg daily. Medication taken several hours ago. - Rechecked blood pressure before leaving  Obesity, class 2 BMI is 37.1, indicating class 2 obesity. Engages in physical activity at work but lacks additional exercise. Diet includes salads and vegetables but struggles with chicken consumption. - Recommended lifestyle modifications including increased physical activity and dietary adjustments  Mixed hyperlipidemia Currently on simvastatin  20 mg daily.  Prediabetes No specific discussion or assessment of prediabetes in this encounter.  Low back pain Reports low back pain after lifting a heavy treadmill. Pain is improving and not currently taking any medication for it.  Prostate cancer screening Due for PSA test as part of prostate cancer screening. - Ordered PSA test     -Prostate cancer screening and PSA options (with potential risks and benefits of testing vs not testing) were discussed along with recent recs/guidelines. -USPSTF grade A and B recommendations reviewed with patient; age-appropriate recommendations, preventive care, screening tests, etc discussed and encouraged; healthy living encouraged; see AVS for patient education given to patient -Discussed importance of 150 minutes of physical activity weekly, eat two servings of fish weekly, eat one serving of tree nuts ( cashews, pistachios, pecans, almonds.SABRA) every other day, eat 6 servings of fruit/vegetables daily and drink plenty of water and avoid sweet beverages.  -Reviewed Health Maintenance: yes

## 2024-05-25 LAB — COMPREHENSIVE METABOLIC PANEL WITH GFR
AG Ratio: 1.6 (calc) (ref 1.0–2.5)
ALT: 22 U/L (ref 9–46)
AST: 35 U/L (ref 10–35)
Albumin: 4.2 g/dL (ref 3.6–5.1)
Alkaline phosphatase (APISO): 37 U/L (ref 35–144)
BUN/Creatinine Ratio: 16 (calc) (ref 6–22)
BUN: 23 mg/dL (ref 7–25)
CO2: 32 mmol/L (ref 20–32)
Calcium: 9.2 mg/dL (ref 8.6–10.3)
Chloride: 103 mmol/L (ref 98–110)
Creat: 1.44 mg/dL — ABNORMAL HIGH (ref 0.70–1.30)
Globulin: 2.7 g/dL (ref 1.9–3.7)
Glucose, Bld: 93 mg/dL (ref 65–99)
Potassium: 3.9 mmol/L (ref 3.5–5.3)
Sodium: 141 mmol/L (ref 135–146)
Total Bilirubin: 0.8 mg/dL (ref 0.2–1.2)
Total Protein: 6.9 g/dL (ref 6.1–8.1)
eGFR: 58 mL/min/1.73m2 — ABNORMAL LOW (ref >=60)

## 2024-05-25 LAB — CBC WITH DIFFERENTIAL/PLATELET
Absolute Lymphocytes: 2674 {cells}/uL (ref 850–3900)
Absolute Monocytes: 400 {cells}/uL (ref 200–950)
Basophils Absolute: 19 {cells}/uL (ref 0–200)
Basophils Relative: 0.4 %
Eosinophils Absolute: 71 {cells}/uL (ref 15–500)
Eosinophils Relative: 1.5 %
HCT: 43 % (ref 38.5–50.0)
Hemoglobin: 13.8 g/dL (ref 13.2–17.1)
MCH: 27.7 pg (ref 27.0–33.0)
MCHC: 32.1 g/dL (ref 32.0–36.0)
MCV: 86.2 fL (ref 80.0–100.0)
MPV: 13.3 fL — ABNORMAL HIGH (ref 7.5–12.5)
Monocytes Relative: 8.5 %
Neutro Abs: 1537 {cells}/uL (ref 1500–7800)
Neutrophils Relative %: 32.7 %
Platelets: 131 Thousand/uL — ABNORMAL LOW (ref 140–400)
RBC: 4.99 Million/uL (ref 4.20–5.80)
RDW: 13 % (ref 11.0–15.0)
Total Lymphocyte: 56.9 %
WBC: 4.7 Thousand/uL (ref 3.8–10.8)

## 2024-05-25 LAB — LIPID PANEL
Cholesterol: 159 mg/dL (ref <200)
HDL: 57 mg/dL (ref >=40)
LDL Cholesterol (Calc): 87 mg/dL
Non-HDL Cholesterol (Calc): 102 mg/dL (ref <130)
Total CHOL/HDL Ratio: 2.8 (calc) (ref <5.0)
Triglycerides: 67 mg/dL (ref <150)

## 2024-05-25 LAB — HEMOGLOBIN A1C
Hgb A1c MFr Bld: 6 % — ABNORMAL HIGH (ref <5.7)
Mean Plasma Glucose: 126 mg/dL
eAG (mmol/L): 7 mmol/L

## 2024-05-25 LAB — PSA: PSA: 2.32 ng/mL (ref <=4.00)

## 2024-05-27 ENCOUNTER — Ambulatory Visit: Payer: Self-pay | Admitting: Nurse Practitioner

## 2024-06-14 ENCOUNTER — Ambulatory Visit: Payer: Self-pay

## 2024-06-14 ENCOUNTER — Ambulatory Visit
Admission: RE | Admit: 2024-06-14 | Discharge: 2024-06-14 | Disposition: A | Discharge status: Home / Self Care | Attending: Emergency Medicine

## 2024-06-14 ENCOUNTER — Ambulatory Visit (INDEPENDENT_AMBULATORY_CARE_PROVIDER_SITE_OTHER)

## 2024-06-14 ENCOUNTER — Ambulatory Visit (HOSPITAL_COMMUNITY): Payer: Self-pay

## 2024-06-14 VITALS — BP 173/99 | HR 67 | Temp 98.0°F | Resp 18

## 2024-06-14 DIAGNOSIS — I10 Essential (primary) hypertension: Secondary | ICD-10-CM

## 2024-06-14 DIAGNOSIS — M25562 Pain in left knee: Secondary | ICD-10-CM

## 2024-06-14 NOTE — ED Provider Notes (Signed)
 Jason Leonard    CSN: [id number] Arrival date & time: 06/14/24  1501      History   Chief Complaint Chief Complaint  Patient presents with   Knee Pain    twisting/hyperextension - Entered by patient    HPI Jason Leonard is a 53 y.o. male.  Patient presents with left knee pain and swelling x 5 days.  His symptoms started when he missed a step while walking down steps carrying a Christmas tree.  He states he jammed his knee.  He only has pain when his knee is fully straightened and he is weightbearing, such as when he is walking.  No open wounds, rash, bruising, erythema, warmth, numbness, tingling, paresthesias.  No OTC medication taken today.  Patient reports history of bilateral knee injuries and surgeries many years ago.  Patient's medical history includes hypertension.  He has not taken his blood pressure medications yet today.  He denies chest pain, shortness of breath, numbness, weakness.  The history is provided by the patient and medical records.    Past Medical History:  Diagnosis Date   Hyperlipidemia N/A   low HDL, high LDL - no meds, diet controlled   Hypertension 09/21/2018    Patient Active Problem List   Diagnosis Date Noted   Class 2 obesity with body mass index (BMI) of 35.0 to 35.9 in adult 05/24/2024   Prediabetes 11/07/2023   Obesity (BMI 30-39.9) 12/17/2020   Hypertension 06/14/2018   Hyperlipidemia 06/14/2018    Past Surgical History:  Procedure Laterality Date   ARTHROSCOPIC REPAIR ACL     COLONOSCOPY WITH PROPOFOL  N/A 01/09/2020   Procedure: COLONOSCOPY WITH PROPOFOL ;  Surgeon: Therisa Bi, MD;  Location: [facility] ENDOSCOPY;  Service: Gastroenterology;  Laterality: N/A;   WISDOM TOOTH EXTRACTION         Home Medications    Prior to Admission medications  Medication Sig Start Date End Date Taking? Authorizing Provider  amLODipine  (NORVASC ) 5 MG tablet Take 1 tablet (5 mg total) by mouth daily. 11/06/23   Tapia, Leisa, PA-C   hydrochlorothiazide  (MICROZIDE ) 12.5 MG capsule Take 1 capsule (12.5 mg total) by mouth daily. 11/06/23   Tapia, Leisa, PA-C  lisinopril  (ZESTRIL ) 40 MG tablet Take 1 tablet (40 mg total) by mouth daily. 11/06/23   Tapia, Leisa, PA-C  simvastatin  (ZOCOR ) 20 MG tablet Take 1 tablet (20 mg total) by mouth at bedtime. 11/06/23   Leavy Mole, PA-C    Family History Family History  Problem Relation Age of Onset   Hypertension Mother    Hypertension Father    Lupus Sister    Diabetes Maternal Grandfather    Cancer Maternal Grandfather    Cancer Paternal Grandmother     Social History Social History[1]   Allergies   Patient has no known allergies.   Review of Systems Review of Systems  Respiratory:  Negative for cough and shortness of breath.   Cardiovascular:  Negative for chest pain and palpitations.  Musculoskeletal:  Positive for arthralgias and joint swelling. Negative for gait problem.  Skin:  Negative for color change, rash and wound.  Neurological:  Negative for weakness and numbness.     Physical Exam Triage Vital Signs ED Triage Vitals [06/14/24 1528]  Encounter Vitals Group     BP      Girls Systolic BP Percentile      Girls Diastolic BP Percentile      Boys Systolic BP Percentile      Boys Diastolic BP Percentile  Pulse Rate 67     Resp 18     Temp 98 F (36.7 C)     Temp src      SpO2 98 %     Weight      Height      Head Circumference      Peak Flow      Pain Score      Pain Loc      Pain Education      Exclude from Growth Chart    No data found.  Updated Vital Signs BP (!) 173/99   Pulse 67   Temp 98 F (36.7 C)   Resp 18   SpO2 98%   Visual Acuity Right Eye Distance:   Left Eye Distance:   Bilateral Distance:    Right Eye Near:   Left Eye Near:    Bilateral Near:     Physical Exam Constitutional:      General: He is not in acute distress. HENT:     Mouth/Throat:     Mouth: Mucous membranes are moist.  Cardiovascular:      Rate and Rhythm: Normal rate.  Pulmonary:     Effort: Pulmonary effort is normal. No respiratory distress.  Musculoskeletal:        General: Swelling present. No tenderness or deformity. Normal range of motion.     Comments: Left knee nontender to palpation; mild generalized edema; no ecchymosis, erythema, wounds.  Calf nontender.  LLE: FROM, sensation intact, strength 5/5.  Skin:    General: Skin is warm and dry.     Capillary Refill: Capillary refill takes less than 2 seconds.     Findings: No bruising, erythema, lesion or rash.  Neurological:     General: No focal deficit present.     Mental Status: He is alert.     Sensory: No sensory deficit.     Motor: No weakness.     Gait: Gait normal.      UC Treatments / Results  Labs (all labs ordered are listed, but only abnormal results are displayed) Labs Reviewed - No data to display  EKG   Radiology DG Knee Complete 4 Views Left Result Date: 06/14/2024 EXAM: 4 OR MORE VIEW(S) XRAY OF THE LEFT KNEE 06/14/2024 04:07:34 PM COMPARISON: None available. CLINICAL HISTORY: pain s/p injury FINDINGS: BONES AND JOINTS: No acute fracture. No malalignment. Moderate suprapatellar effusion. Small tricompartmental marginal spurs. With mild medial joint space narrowing. SOFT TISSUES: The soft tissues are unremarkable. IMPRESSION: 1. Moderate suprapatellar effusion. 2. Small tricompartmental marginal spurs with mild medial compartment joint space narrowing consistent with osteoarthrosis. Electronically signed by: Dayne Hassell MD 06/14/2024 04:32 PM EST RP Workstation: [id number]    Procedures Procedures (including critical care time)  Medications Ordered in UC Medications - No data to display  Initial Impression / Assessment and Plan / UC Course  I have reviewed the triage vital signs and the nursing notes.  Pertinent labs & imaging results that were available during my care of the patient were reviewed by me and considered in my medical  decision making (see chart for details).   Left knee pain, elevated blood pressure reading with hypertension.  Xray shows moderate suprapatellar effusion and osteoarthritis per radiologist read.  Treating with knee sleeve, rest, elevation, ice packs, ibuprofen as needed.  Education provided on knee pain.  Instructed patient to follow-up with an orthopedist.  Contact information for on-call Ortho provided.  Also discussed with patient that his blood pressure is elevated  today.  He has not taken his blood pressure medications yet today but will take them as soon as he gets home.  Instructed him to follow-up with his PCP on Monday for recheck of his blood pressure.  ED precautions given.  Education provided on managing hypertension.  He agrees to plan of care.  Final Clinical Impressions(s) / UC Diagnoses   Final diagnoses:  Acute pain of left knee  Elevated blood pressure reading in office with diagnosis of hypertension     Discharge Instructions      Wear the knee sleeve as directed.  Rest and elevate your knee.  Apply ice packs as directed.  Follow-up with an orthopedist such as the one listed below.    Your blood pressure is elevated today; 173/99.  Take your blood pressure medication as directed.  See the attached handout on managing hypertension.  Follow-up with your primary care provider on Monday.    To the emergency department if you have worsening symptoms.     ED Prescriptions   None    PDMP not reviewed this encounter.     [1]  Social History Tobacco Use   Smoking status: Never   Smokeless tobacco: Never  Vaping Use   Vaping status: Never Used  Substance Use Topics   Alcohol use: No   Drug use: No     Corlis Burnard DEL, NP 06/14/24 1654

## 2024-06-14 NOTE — ED Triage Notes (Addendum)
 Patient to Urgent Care with complaints of left sided knee swelling. Only experiences pain with twisting movement. Reports that he was carrying his christmas tree, slipped on a step and jammed his knee.   Symptoms started Sunday. Hx of previous knee injuries.

## 2024-06-14 NOTE — Discharge Instructions (Addendum)
 Wear the knee sleeve as directed.  Rest and elevate your knee.  Apply ice packs as directed.  Follow-up with an orthopedist such as the one listed below.    Your blood pressure is elevated today; 173/99.  Take your blood pressure medication as directed.  See the attached handout on managing hypertension.  Follow-up with your primary care provider on Monday.    To the emergency department if you have worsening symptoms.

## 2024-06-14 NOTE — Telephone Encounter (Signed)
 FYI Only or Action Required?: FYI only for provider: appointment scheduled on 12/12.  Patient was last seen in primary care on 05/24/2024 by Jason Mliss FALCON, FNP.  Called Nurse Triage reporting Knee Injury.  Symptoms began several days ago.  Interventions attempted: OTC medications: ibuprofen and Ice/heat application.  Symptoms are: gradually worsening.  Triage Disposition: See PCP When Office is Open (Within 3 Days)  Patient/caregiver understands and will follow disposition?: Yes  Copied from CRM #[id number]. Topic: Clinical - Red Word Triage >> Jun 14, 2024  9:38 AM Harlene ORN wrote: Red Word that prompted transfer to Nurse Triage: Injured his left knee around Sunday or Monday. Pain and swelling after three days Reason for Disposition  [1] Limp when walking AND [2] due to a twisted knee  Answer Assessment - Initial Assessment Questions Sunday- Missed a step and hyperextended his knee Swelled up the next day- it is noticeably swollen still- when aggrevates it by accidentally stepping the wrong way, swelling gets slightly worse. Sharp jarring pain 7/10 when he steps wrong. But no pain otherwise.  Can bend it fine. Denies any color or temp changes, sensation changes, or hot hard knot.  Has a brace on it- ICE, Ibuprofen  Injured previously- meniscus tear playing football when younger  Appt with [facility] at 315pm.   1. MECHANISM: How did the injury happen? (e.g., twisting injury, direct blow)      Missed a test  2. ONSET: When did the injury happen? (e.g., minutes, hours ago)      Sunday  3. LOCATION: Where is the injury located?      Left Knee 4. APPEARANCE of INJURY: What does the injury look like?      Swollen  5. SEVERITY: Can you put weight on that leg? Can you walk?      mild 6. SIZE: For cuts, bruises, or swelling, ask: How large is it? (e.g., inches or centimeters;  entire joint)      Noticably swollen  7. PAIN: Is there pain? If Yes, ask: How bad is the pain?    What does it keep you from doing? (Scale 0-10; or none, mild, moderate, severe)     0/10 and if steps on it wrong 7/10 a short shocking pain and over in a few seconds  9. OTHER SYMPTOMS: Do you have any other symptoms?  (e.g., pop when knee injured, swelling, locking, buckling)      Swelling above the knee  Protocols used: Knee Injury-A-AH

## 2024-06-28 ENCOUNTER — Telehealth: Payer: Self-pay

## 2024-06-28 NOTE — Telephone Encounter (Signed)
 Refill request on  lisinopril  (ZESTRIL ) 40 MG tablet

## 2024-07-01 ENCOUNTER — Other Ambulatory Visit: Payer: Self-pay

## 2024-07-01 ENCOUNTER — Telehealth: Payer: Self-pay | Admitting: Nurse Practitioner

## 2024-07-01 DIAGNOSIS — I10 Essential (primary) hypertension: Secondary | ICD-10-CM

## 2024-07-01 MED ORDER — LISINOPRIL 40 MG PO TABS: 40.0000 mg | ORAL_TABLET | Freq: Every day | ORAL | 1 refills | Status: AC

## 2024-07-01 MED ORDER — HYDROCHLOROTHIAZIDE 12.5 MG PO CAPS: 12.5000 mg | ORAL_CAPSULE | Freq: Every day | ORAL | 1 refills | Status: AC

## 2024-07-01 NOTE — Telephone Encounter (Signed)
 hydrochlorothiazide  (MICROZIDE ) 12.5 MG capsule

## 2024-08-01 ENCOUNTER — Other Ambulatory Visit: Payer: Self-pay

## 2024-08-01 DIAGNOSIS — E782 Mixed hyperlipidemia: Secondary | ICD-10-CM

## 2024-08-01 MED ORDER — SIMVASTATIN 20 MG PO TABS: 20.0000 mg | ORAL_TABLET | Freq: Every day | ORAL | 1 refills | Status: AC

## 2024-11-22 ENCOUNTER — Ambulatory Visit: Admitting: Nurse Practitioner
